# Patient Record
Sex: Male | Born: 1960 | Race: Black or African American | Hispanic: No | Marital: Single | Smoking: Former smoker
Health system: Southern US, Community
[De-identification: ages and names within clinical notes are randomized; demographics above are authoritative.]

## PROBLEM LIST (undated history)

## (undated) DIAGNOSIS — I10 Essential (primary) hypertension: Secondary | ICD-10-CM

## (undated) DIAGNOSIS — M1990 Unspecified osteoarthritis, unspecified site: Secondary | ICD-10-CM

## (undated) DIAGNOSIS — J449 Chronic obstructive pulmonary disease, unspecified: Secondary | ICD-10-CM

## (undated) HISTORY — PX: REPLACEMENT TOTAL KNEE: SUR1224

---

## 1998-05-14 ENCOUNTER — Encounter: Payer: Self-pay | Admitting: Family Medicine

## 1998-05-14 ENCOUNTER — Inpatient Hospital Stay (HOSPITAL_COMMUNITY): Admission: EM | Admit: 1998-05-14 | Discharge: 1998-05-15 | Payer: Self-pay | Admitting: Emergency Medicine

## 1998-05-24 ENCOUNTER — Encounter: Admission: RE | Admit: 1998-05-24 | Discharge: 1998-05-24 | Payer: Self-pay | Admitting: Family Medicine

## 1998-11-22 ENCOUNTER — Emergency Department (HOSPITAL_COMMUNITY): Admission: EM | Admit: 1998-11-22 | Discharge: 1998-11-22 | Payer: Self-pay | Admitting: Emergency Medicine

## 1999-01-19 ENCOUNTER — Ambulatory Visit: Admission: RE | Admit: 1999-01-19 | Discharge: 1999-01-19 | Payer: Self-pay

## 1999-06-02 ENCOUNTER — Emergency Department (HOSPITAL_COMMUNITY): Admission: EM | Admit: 1999-06-02 | Discharge: 1999-06-02 | Payer: Self-pay | Admitting: Emergency Medicine

## 1999-06-17 ENCOUNTER — Emergency Department (HOSPITAL_COMMUNITY): Admission: EM | Admit: 1999-06-17 | Discharge: 1999-06-17 | Payer: Self-pay | Admitting: *Deleted

## 1999-12-01 ENCOUNTER — Encounter: Payer: Self-pay | Admitting: Emergency Medicine

## 1999-12-01 ENCOUNTER — Emergency Department (HOSPITAL_COMMUNITY): Admission: EM | Admit: 1999-12-01 | Discharge: 1999-12-01 | Payer: Self-pay | Admitting: Emergency Medicine

## 2000-06-06 ENCOUNTER — Emergency Department (HOSPITAL_COMMUNITY): Admission: EM | Admit: 2000-06-06 | Discharge: 2000-06-06 | Payer: Self-pay

## 2000-06-06 ENCOUNTER — Encounter: Payer: Self-pay | Admitting: Emergency Medicine

## 2001-03-24 ENCOUNTER — Inpatient Hospital Stay (HOSPITAL_COMMUNITY): Admission: EM | Admit: 2001-03-24 | Discharge: 2001-03-28 | Payer: Self-pay | Admitting: Infectious Diseases

## 2001-03-25 ENCOUNTER — Encounter: Payer: Self-pay | Admitting: Infectious Diseases

## 2001-06-07 ENCOUNTER — Encounter: Payer: Self-pay | Admitting: Emergency Medicine

## 2001-06-07 ENCOUNTER — Inpatient Hospital Stay (HOSPITAL_COMMUNITY): Admission: EM | Admit: 2001-06-07 | Discharge: 2001-06-11 | Payer: Self-pay | Admitting: Emergency Medicine

## 2001-06-08 ENCOUNTER — Encounter: Payer: Self-pay | Admitting: Internal Medicine

## 2001-07-19 ENCOUNTER — Inpatient Hospital Stay (HOSPITAL_COMMUNITY): Admission: EM | Admit: 2001-07-19 | Discharge: 2001-07-20 | Payer: Self-pay | Admitting: Emergency Medicine

## 2001-07-19 ENCOUNTER — Encounter: Payer: Self-pay | Admitting: Emergency Medicine

## 2001-09-14 ENCOUNTER — Emergency Department (HOSPITAL_COMMUNITY): Admission: EM | Admit: 2001-09-14 | Discharge: 2001-09-14 | Payer: Self-pay | Admitting: Emergency Medicine

## 2001-09-14 ENCOUNTER — Encounter: Payer: Self-pay | Admitting: Emergency Medicine

## 2001-10-12 ENCOUNTER — Inpatient Hospital Stay (HOSPITAL_COMMUNITY): Admission: EM | Admit: 2001-10-12 | Discharge: 2001-10-14 | Payer: Self-pay

## 2001-10-12 ENCOUNTER — Encounter: Payer: Self-pay | Admitting: Infectious Diseases

## 2001-11-10 ENCOUNTER — Encounter: Payer: Self-pay | Admitting: Emergency Medicine

## 2001-11-10 ENCOUNTER — Emergency Department (HOSPITAL_COMMUNITY): Admission: EM | Admit: 2001-11-10 | Discharge: 2001-11-11 | Payer: Self-pay | Admitting: Emergency Medicine

## 2001-12-10 ENCOUNTER — Emergency Department (HOSPITAL_COMMUNITY): Admission: EM | Admit: 2001-12-10 | Discharge: 2001-12-10 | Payer: Self-pay | Admitting: Emergency Medicine

## 2002-01-13 ENCOUNTER — Emergency Department (HOSPITAL_COMMUNITY): Admission: EM | Admit: 2002-01-13 | Discharge: 2002-01-13 | Payer: Self-pay | Admitting: Emergency Medicine

## 2002-02-03 ENCOUNTER — Emergency Department (HOSPITAL_COMMUNITY): Admission: EM | Admit: 2002-02-03 | Discharge: 2002-02-03 | Payer: Self-pay | Admitting: Emergency Medicine

## 2002-02-05 ENCOUNTER — Inpatient Hospital Stay (HOSPITAL_COMMUNITY): Admission: EM | Admit: 2002-02-05 | Discharge: 2002-02-07 | Payer: Self-pay | Admitting: *Deleted

## 2002-02-06 ENCOUNTER — Encounter: Payer: Self-pay | Admitting: Internal Medicine

## 2002-03-09 ENCOUNTER — Inpatient Hospital Stay (HOSPITAL_COMMUNITY): Admission: EM | Admit: 2002-03-09 | Discharge: 2002-03-11 | Payer: Self-pay | Admitting: Emergency Medicine

## 2002-03-09 ENCOUNTER — Encounter: Payer: Self-pay | Admitting: Emergency Medicine

## 2002-03-10 ENCOUNTER — Encounter (INDEPENDENT_AMBULATORY_CARE_PROVIDER_SITE_OTHER): Payer: Self-pay | Admitting: Cardiology

## 2002-03-10 ENCOUNTER — Encounter: Payer: Self-pay | Admitting: Internal Medicine

## 2002-05-12 ENCOUNTER — Inpatient Hospital Stay (HOSPITAL_COMMUNITY): Admission: EM | Admit: 2002-05-12 | Discharge: 2002-05-12 | Payer: Self-pay | Admitting: Emergency Medicine

## 2002-05-12 ENCOUNTER — Encounter: Payer: Self-pay | Admitting: Internal Medicine

## 2002-06-09 ENCOUNTER — Emergency Department (HOSPITAL_COMMUNITY): Admission: EM | Admit: 2002-06-09 | Discharge: 2002-06-10 | Payer: Self-pay | Admitting: Emergency Medicine

## 2002-06-10 ENCOUNTER — Encounter: Payer: Self-pay | Admitting: Emergency Medicine

## 2002-07-03 ENCOUNTER — Encounter: Payer: Self-pay | Admitting: Emergency Medicine

## 2002-07-03 ENCOUNTER — Emergency Department (HOSPITAL_COMMUNITY): Admission: EM | Admit: 2002-07-03 | Discharge: 2002-07-04 | Payer: Self-pay | Admitting: Emergency Medicine

## 2002-07-11 ENCOUNTER — Emergency Department (HOSPITAL_COMMUNITY): Admission: EM | Admit: 2002-07-11 | Discharge: 2002-07-11 | Payer: Self-pay | Admitting: Emergency Medicine

## 2002-08-05 ENCOUNTER — Emergency Department (HOSPITAL_COMMUNITY): Admission: EM | Admit: 2002-08-05 | Discharge: 2002-08-05 | Payer: Self-pay | Admitting: Emergency Medicine

## 2002-09-24 ENCOUNTER — Emergency Department (HOSPITAL_COMMUNITY): Admission: EM | Admit: 2002-09-24 | Discharge: 2002-09-24 | Payer: Self-pay | Admitting: Emergency Medicine

## 2002-11-03 ENCOUNTER — Emergency Department (HOSPITAL_COMMUNITY): Admission: EM | Admit: 2002-11-03 | Discharge: 2002-11-03 | Payer: Self-pay | Admitting: Emergency Medicine

## 2002-11-03 ENCOUNTER — Encounter: Payer: Self-pay | Admitting: Emergency Medicine

## 2002-12-15 ENCOUNTER — Emergency Department (HOSPITAL_COMMUNITY): Admission: EM | Admit: 2002-12-15 | Discharge: 2002-12-15 | Payer: Self-pay | Admitting: Emergency Medicine

## 2003-02-09 ENCOUNTER — Emergency Department (HOSPITAL_COMMUNITY): Admission: EM | Admit: 2003-02-09 | Discharge: 2003-02-09 | Payer: Self-pay | Admitting: Emergency Medicine

## 2003-04-24 ENCOUNTER — Emergency Department (HOSPITAL_COMMUNITY): Admission: EM | Admit: 2003-04-24 | Discharge: 2003-04-24 | Payer: Self-pay | Admitting: Emergency Medicine

## 2003-09-08 ENCOUNTER — Emergency Department (HOSPITAL_COMMUNITY): Admission: EM | Admit: 2003-09-08 | Discharge: 2003-09-08 | Payer: Self-pay | Admitting: *Deleted

## 2003-09-14 ENCOUNTER — Inpatient Hospital Stay (HOSPITAL_COMMUNITY): Admission: EM | Admit: 2003-09-14 | Discharge: 2003-09-17 | Payer: Self-pay | Admitting: Emergency Medicine

## 2004-02-02 ENCOUNTER — Emergency Department (HOSPITAL_COMMUNITY): Admission: EM | Admit: 2004-02-02 | Discharge: 2004-02-02 | Payer: Self-pay | Admitting: Emergency Medicine

## 2004-02-10 ENCOUNTER — Emergency Department (HOSPITAL_COMMUNITY): Admission: EM | Admit: 2004-02-10 | Discharge: 2004-02-10 | Payer: Self-pay | Admitting: Emergency Medicine

## 2004-02-17 ENCOUNTER — Emergency Department (HOSPITAL_COMMUNITY): Admission: EM | Admit: 2004-02-17 | Discharge: 2004-02-17 | Payer: Self-pay | Admitting: Emergency Medicine

## 2004-02-23 ENCOUNTER — Inpatient Hospital Stay (HOSPITAL_COMMUNITY): Admission: EM | Admit: 2004-02-23 | Discharge: 2004-02-25 | Payer: Self-pay | Admitting: *Deleted

## 2004-04-14 ENCOUNTER — Emergency Department (HOSPITAL_COMMUNITY): Admission: EM | Admit: 2004-04-14 | Discharge: 2004-04-14 | Payer: Self-pay

## 2004-05-01 ENCOUNTER — Inpatient Hospital Stay (HOSPITAL_COMMUNITY): Admission: EM | Admit: 2004-05-01 | Discharge: 2004-05-03 | Payer: Self-pay | Admitting: Emergency Medicine

## 2004-05-01 ENCOUNTER — Ambulatory Visit: Payer: Self-pay | Admitting: Internal Medicine

## 2004-06-13 ENCOUNTER — Emergency Department (HOSPITAL_COMMUNITY): Admission: EM | Admit: 2004-06-13 | Discharge: 2004-06-13 | Payer: Self-pay | Admitting: Emergency Medicine

## 2004-09-06 ENCOUNTER — Emergency Department (HOSPITAL_COMMUNITY): Admission: EM | Admit: 2004-09-06 | Discharge: 2004-09-06 | Payer: Self-pay | Admitting: Emergency Medicine

## 2004-11-01 ENCOUNTER — Emergency Department (HOSPITAL_COMMUNITY): Admission: EM | Admit: 2004-11-01 | Discharge: 2004-11-01 | Payer: Self-pay | Admitting: Emergency Medicine

## 2004-11-09 ENCOUNTER — Inpatient Hospital Stay (HOSPITAL_COMMUNITY): Admission: EM | Admit: 2004-11-09 | Discharge: 2004-11-11 | Payer: Self-pay | Admitting: Emergency Medicine

## 2004-12-26 ENCOUNTER — Emergency Department (HOSPITAL_COMMUNITY): Admission: EM | Admit: 2004-12-26 | Discharge: 2004-12-26 | Payer: Self-pay | Admitting: Emergency Medicine

## 2005-01-03 ENCOUNTER — Emergency Department (HOSPITAL_COMMUNITY): Admission: EM | Admit: 2005-01-03 | Discharge: 2005-01-04 | Payer: Self-pay | Admitting: Emergency Medicine

## 2005-01-19 ENCOUNTER — Emergency Department (HOSPITAL_COMMUNITY): Admission: EM | Admit: 2005-01-19 | Discharge: 2005-01-19 | Payer: Self-pay | Admitting: Emergency Medicine

## 2005-02-07 ENCOUNTER — Ambulatory Visit: Payer: Self-pay | Admitting: Family Medicine

## 2005-02-07 ENCOUNTER — Inpatient Hospital Stay (HOSPITAL_COMMUNITY): Admission: EM | Admit: 2005-02-07 | Discharge: 2005-02-09 | Payer: Self-pay | Admitting: Emergency Medicine

## 2005-03-21 ENCOUNTER — Observation Stay (HOSPITAL_COMMUNITY): Admission: EM | Admit: 2005-03-21 | Discharge: 2005-03-22 | Payer: Self-pay | Admitting: Emergency Medicine

## 2005-04-06 ENCOUNTER — Inpatient Hospital Stay (HOSPITAL_COMMUNITY): Admission: EM | Admit: 2005-04-06 | Discharge: 2005-04-08 | Payer: Self-pay | Admitting: Emergency Medicine

## 2005-07-10 ENCOUNTER — Emergency Department (HOSPITAL_COMMUNITY): Admission: EM | Admit: 2005-07-10 | Discharge: 2005-07-10 | Payer: Self-pay | Admitting: *Deleted

## 2005-08-03 ENCOUNTER — Inpatient Hospital Stay (HOSPITAL_COMMUNITY): Admission: EM | Admit: 2005-08-03 | Discharge: 2005-08-04 | Payer: Self-pay | Admitting: Emergency Medicine

## 2005-08-03 ENCOUNTER — Ambulatory Visit: Payer: Self-pay | Admitting: Sports Medicine

## 2005-08-29 ENCOUNTER — Ambulatory Visit: Payer: Self-pay | Admitting: Family Medicine

## 2005-09-27 IMAGING — CR DG CHEST 1V PORT
1 series · 1 of 1 positions shown · non-contrast
Comparison: none

CLINICAL DATA: Short of breath, chest pain, hypertension, nonsmoker.  Respiratory distress.
 PORTABLE ONE VIEW CEHST, 02/23/04, 3743 HOURS:
 An AP semi-erect portable film of the chest dated 02/23/04 at 3743 hours is made and compared to previous study of 09/14/03 at 5235 hours and shows again severe chronic obstructive lung changes with marked scarring.  Bleb and bullae formation in region of the apices and left lung base.  There is also some generalized peribronchial thickening of the hilar and right lower lobe areas.  No consolidation is seen.  There is blunting of both costophrenic angles.  The heart and mediastinum are normal.
 IMPRESSION
 Severe chronic obstructive pulmonary disease with no definite acute infiltrate or consolidation.  Heart is normal.

[view not recorded]
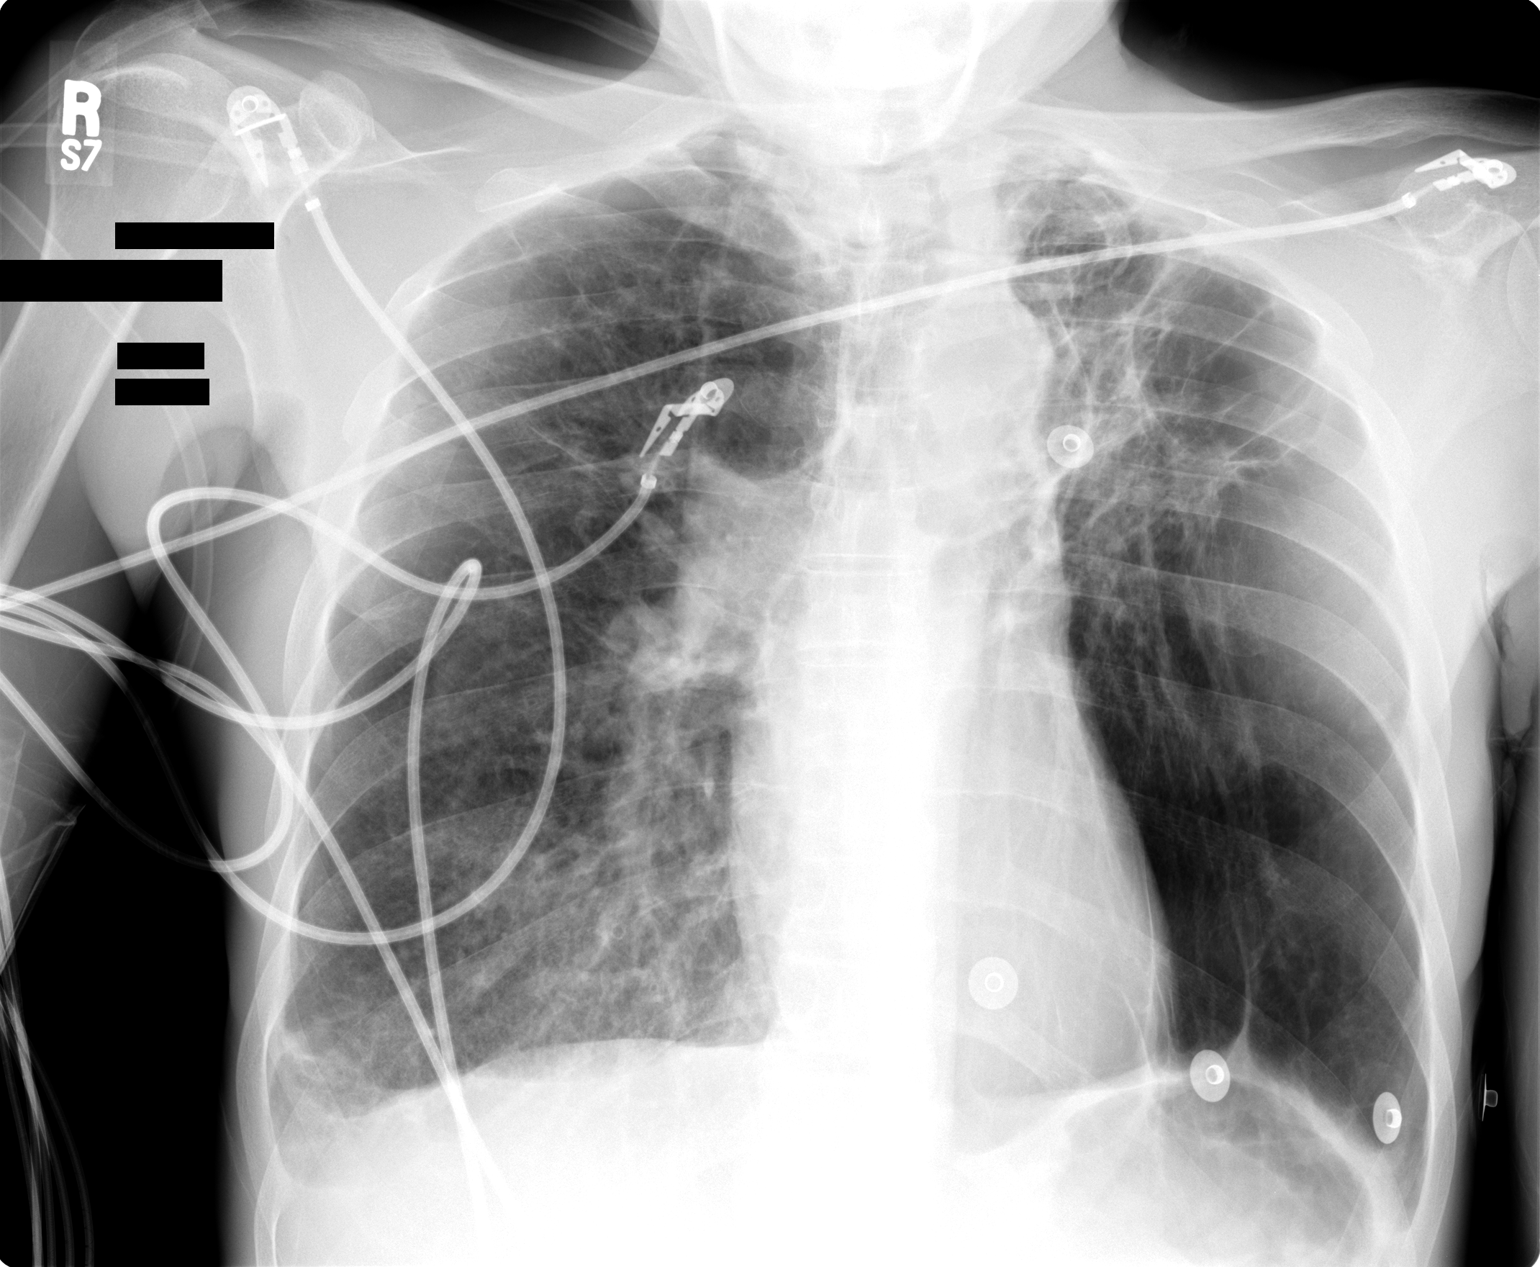

[1 of 1 positions shown; findings below may reference images not displayed]

## 2005-10-17 ENCOUNTER — Ambulatory Visit: Payer: Self-pay | Admitting: Sports Medicine

## 2005-10-17 ENCOUNTER — Inpatient Hospital Stay (HOSPITAL_COMMUNITY): Admission: EM | Admit: 2005-10-17 | Discharge: 2005-10-19 | Payer: Self-pay | Admitting: Emergency Medicine

## 2005-10-18 ENCOUNTER — Encounter: Payer: Self-pay | Admitting: Vascular Surgery

## 2006-02-20 ENCOUNTER — Emergency Department (HOSPITAL_COMMUNITY): Admission: EM | Admit: 2006-02-20 | Discharge: 2006-02-21 | Payer: Self-pay | Admitting: Emergency Medicine

## 2006-06-11 ENCOUNTER — Inpatient Hospital Stay (HOSPITAL_COMMUNITY): Admission: EM | Admit: 2006-06-11 | Discharge: 2006-06-16 | Payer: Self-pay | Admitting: Emergency Medicine

## 2006-08-11 ENCOUNTER — Emergency Department (HOSPITAL_COMMUNITY): Admission: EM | Admit: 2006-08-11 | Discharge: 2006-08-11 | Payer: Self-pay | Admitting: Emergency Medicine

## 2006-08-13 ENCOUNTER — Ambulatory Visit: Payer: Self-pay | Admitting: Family Medicine

## 2006-08-29 ENCOUNTER — Ambulatory Visit (HOSPITAL_COMMUNITY): Admission: RE | Admit: 2006-08-29 | Discharge: 2006-08-29 | Payer: Self-pay | Admitting: Family Medicine

## 2006-10-08 ENCOUNTER — Ambulatory Visit: Payer: Self-pay | Admitting: Family Medicine

## 2006-12-04 ENCOUNTER — Ambulatory Visit: Payer: Self-pay | Admitting: Family Medicine

## 2007-05-08 ENCOUNTER — Ambulatory Visit: Payer: Self-pay | Admitting: Family Medicine

## 2007-07-30 ENCOUNTER — Ambulatory Visit: Payer: Self-pay | Admitting: Family Medicine

## 2007-07-30 LAB — CONVERTED CEMR LAB
AST: 14 units/L (ref 0–37)
Alkaline Phosphatase: 84 units/L (ref 39–117)
BUN: 14 mg/dL (ref 6–23)
Basophils Absolute: 0 10*3/uL (ref 0.0–0.1)
Basophils Relative: 1 % (ref 0–1)
Calcium: 9.5 mg/dL (ref 8.4–10.5)
Creatinine, Ser: 1.55 mg/dL — ABNORMAL HIGH (ref 0.40–1.50)
Eosinophils Relative: 13 % — ABNORMAL HIGH (ref 0–5)
HCT: 40.3 % (ref 39.0–52.0)
Hemoglobin: 13.6 g/dL (ref 13.0–17.0)
MCHC: 33.7 g/dL (ref 30.0–36.0)
Monocytes Absolute: 0.7 10*3/uL (ref 0.1–1.0)
RDW: 12.9 % (ref 11.5–15.5)

## 2007-09-17 ENCOUNTER — Emergency Department (HOSPITAL_COMMUNITY): Admission: EM | Admit: 2007-09-17 | Discharge: 2007-09-18 | Payer: Self-pay | Admitting: Emergency Medicine

## 2007-10-10 ENCOUNTER — Emergency Department (HOSPITAL_COMMUNITY): Admission: EM | Admit: 2007-10-10 | Discharge: 2007-10-10 | Payer: Self-pay | Admitting: Emergency Medicine

## 2007-11-11 ENCOUNTER — Ambulatory Visit: Payer: Self-pay | Admitting: Internal Medicine

## 2008-03-19 ENCOUNTER — Emergency Department (HOSPITAL_COMMUNITY): Admission: EM | Admit: 2008-03-19 | Discharge: 2008-03-19 | Payer: Self-pay | Admitting: Emergency Medicine

## 2008-05-26 ENCOUNTER — Ambulatory Visit: Payer: Self-pay | Admitting: Family Medicine

## 2008-07-15 ENCOUNTER — Inpatient Hospital Stay (HOSPITAL_COMMUNITY): Admission: EM | Admit: 2008-07-15 | Discharge: 2008-07-18 | Payer: Self-pay | Admitting: Emergency Medicine

## 2008-12-24 ENCOUNTER — Ambulatory Visit: Payer: Self-pay | Admitting: Internal Medicine

## 2008-12-24 ENCOUNTER — Encounter (INDEPENDENT_AMBULATORY_CARE_PROVIDER_SITE_OTHER): Payer: Self-pay | Admitting: Family Medicine

## 2008-12-24 LAB — CONVERTED CEMR LAB
ALT: 10 units/L (ref 0–53)
AST: 14 units/L (ref 0–37)
Albumin: 4.3 g/dL (ref 3.5–5.2)
BUN: 12 mg/dL (ref 6–23)
Calcium: 9.6 mg/dL (ref 8.4–10.5)
Chloride: 99 meq/L (ref 96–112)
Eosinophils Absolute: 0.6 10*3/uL (ref 0.0–0.7)
HCT: 41.3 % (ref 39.0–52.0)
Lymphocytes Relative: 25 % (ref 12–46)
Lymphs Abs: 1.7 10*3/uL (ref 0.7–4.0)
MCV: 94.7 fL (ref 78.0–100.0)
Monocytes Relative: 13 % — ABNORMAL HIGH (ref 3–12)
Neutrophils Relative %: 53 % (ref 43–77)
Potassium: 4.2 meq/L (ref 3.5–5.3)
RBC: 4.36 M/uL (ref 4.22–5.81)
Sodium: 136 meq/L (ref 135–145)
Total Protein: 7.8 g/dL (ref 6.0–8.3)
WBC: 6.9 10*3/uL (ref 4.0–10.5)

## 2009-02-18 ENCOUNTER — Ambulatory Visit: Payer: Self-pay | Admitting: Family Medicine

## 2009-04-01 ENCOUNTER — Ambulatory Visit: Payer: Self-pay | Admitting: Family Medicine

## 2009-04-01 LAB — CONVERTED CEMR LAB
AST: 12 units/L (ref 0–37)
Alkaline Phosphatase: 57 units/L (ref 39–117)
BUN: 19 mg/dL (ref 6–23)
Basophils Absolute: 0.1 10*3/uL (ref 0.0–0.1)
Calcium: 9.2 mg/dL (ref 8.4–10.5)
Creatinine, Ser: 1.41 mg/dL (ref 0.40–1.50)
Eosinophils Absolute: 0.6 10*3/uL (ref 0.0–0.7)
Eosinophils Relative: 15 % — ABNORMAL HIGH (ref 0–5)
Glucose, Bld: 91 mg/dL (ref 70–99)
HCT: 39.2 % (ref 39.0–52.0)
Hemoglobin: 13.6 g/dL (ref 13.0–17.0)
MCHC: 34.7 g/dL (ref 30.0–36.0)
MCV: 89.9 fL (ref 78.0–100.0)
Monocytes Absolute: 0.6 10*3/uL (ref 0.1–1.0)
Platelets: 337 10*3/uL (ref 150–400)
RDW: 13.8 % (ref 11.5–15.5)

## 2009-04-26 ENCOUNTER — Ambulatory Visit: Payer: Self-pay | Admitting: Internal Medicine

## 2009-08-12 ENCOUNTER — Ambulatory Visit: Payer: Self-pay | Admitting: Family Medicine

## 2009-08-26 ENCOUNTER — Ambulatory Visit: Payer: Self-pay | Admitting: Internal Medicine

## 2009-08-26 ENCOUNTER — Encounter (INDEPENDENT_AMBULATORY_CARE_PROVIDER_SITE_OTHER): Payer: Self-pay | Admitting: Family Medicine

## 2009-08-26 LAB — CONVERTED CEMR LAB
Amphetamine Screen, Ur: NEGATIVE
Barbiturate Quant, Ur: NEGATIVE
Cocaine Metabolites: POSITIVE — AB
Creatinine,U: 129.1 mg/dL
Opiate Screen, Urine: NEGATIVE
Phencyclidine (PCP): NEGATIVE

## 2009-09-10 ENCOUNTER — Ambulatory Visit: Payer: Self-pay | Admitting: Internal Medicine

## 2009-10-14 ENCOUNTER — Ambulatory Visit: Payer: Self-pay | Admitting: Family Medicine

## 2009-10-14 LAB — CONVERTED CEMR LAB
BUN: 7 mg/dL (ref 6–23)
Barbiturate Quant, Ur: NEGATIVE
CO2: 23 meq/L (ref 19–32)
Chloride: 102 meq/L (ref 96–112)
Creatinine, Ser: 0.8 mg/dL (ref 0.40–1.50)
Creatinine,U: 104.8 mg/dL
Eosinophils Absolute: 0.9 10*3/uL — ABNORMAL HIGH (ref 0.0–0.7)
Eosinophils Relative: 14 % — ABNORMAL HIGH (ref 0–5)
Glucose, Bld: 86 mg/dL (ref 70–99)
HCT: 38.5 % — ABNORMAL LOW (ref 39.0–52.0)
Hemoglobin: 13.1 g/dL (ref 13.0–17.0)
Lymphocytes Relative: 22 % (ref 12–46)
Lymphs Abs: 1.3 10*3/uL (ref 0.7–4.0)
MCV: 89.5 fL (ref 78.0–100.0)
Marijuana Metabolite: NEGATIVE
Methadone: NEGATIVE
Monocytes Absolute: 0.7 10*3/uL (ref 0.1–1.0)
Opiate Screen, Urine: NEGATIVE
Phencyclidine (PCP): NEGATIVE
Platelets: 376 10*3/uL (ref 150–400)
Potassium: 4.6 meq/L (ref 3.5–5.3)
Propoxyphene: NEGATIVE
WBC: 6.2 10*3/uL (ref 4.0–10.5)

## 2009-11-25 ENCOUNTER — Ambulatory Visit: Payer: Self-pay | Admitting: Family Medicine

## 2010-01-04 ENCOUNTER — Ambulatory Visit: Payer: Self-pay | Admitting: Family Medicine

## 2010-01-21 ENCOUNTER — Ambulatory Visit: Payer: Self-pay | Admitting: Family Medicine

## 2010-02-22 ENCOUNTER — Ambulatory Visit: Payer: Self-pay | Admitting: Internal Medicine

## 2010-04-26 ENCOUNTER — Ambulatory Visit: Payer: Self-pay | Admitting: Family Medicine

## 2010-05-10 ENCOUNTER — Emergency Department (HOSPITAL_COMMUNITY)
Admission: EM | Admit: 2010-05-10 | Discharge: 2010-05-10 | Payer: Self-pay | Source: Home / Self Care | Admitting: Emergency Medicine

## 2010-07-27 ENCOUNTER — Emergency Department (HOSPITAL_COMMUNITY)
Admission: EM | Admit: 2010-07-27 | Discharge: 2010-07-27 | Payer: Self-pay | Source: Home / Self Care | Admitting: Emergency Medicine

## 2010-08-01 LAB — PROTIME-INR
INR: 0.98 (ref 0.00–1.49)
Prothrombin Time: 13.2 seconds (ref 11.6–15.2)

## 2010-08-01 LAB — DIFFERENTIAL
Basophils Absolute: 0.1 10*3/uL (ref 0.0–0.1)
Basophils Relative: 1 % (ref 0–1)
Eosinophils Absolute: 0.7 10*3/uL (ref 0.0–0.7)
Eosinophils Relative: 9 % — ABNORMAL HIGH (ref 0–5)
Lymphocytes Relative: 21 % (ref 12–46)
Lymphs Abs: 1.6 10*3/uL (ref 0.7–4.0)
Monocytes Absolute: 1.2 10*3/uL — ABNORMAL HIGH (ref 0.1–1.0)
Monocytes Relative: 16 % — ABNORMAL HIGH (ref 3–12)
Neutro Abs: 3.8 10*3/uL (ref 1.7–7.7)
Neutrophils Relative %: 52 % (ref 43–77)

## 2010-08-01 LAB — CBC
HCT: 39.4 % (ref 39.0–52.0)
Hemoglobin: 13.5 g/dL (ref 13.0–17.0)
MCH: 31.3 pg (ref 26.0–34.0)
MCHC: 34.3 g/dL (ref 30.0–36.0)
MCV: 91.2 fL (ref 78.0–100.0)
Platelets: 303 10*3/uL (ref 150–400)
RBC: 4.32 MIL/uL (ref 4.22–5.81)
RDW: 15.4 % (ref 11.5–15.5)
WBC: 7.3 10*3/uL (ref 4.0–10.5)

## 2010-08-01 LAB — BASIC METABOLIC PANEL
BUN: 9 mg/dL (ref 6–23)
CO2: 27 mEq/L (ref 19–32)
Calcium: 9.9 mg/dL (ref 8.4–10.5)
Chloride: 106 mEq/L (ref 96–112)
Creatinine, Ser: 0.98 mg/dL (ref 0.4–1.5)
GFR calc Af Amer: >60 mL/min (ref >60)
GFR calc non Af Amer: >60 mL/min (ref >60)
Glucose, Bld: 79 mg/dL (ref 70–99)
Potassium: 4.5 mEq/L (ref 3.5–5.1)
Sodium: 140 mEq/L (ref 135–145)

## 2010-08-01 LAB — URINALYSIS, ROUTINE W REFLEX MICROSCOPIC
Bilirubin Urine: NEGATIVE
Hgb urine dipstick: NEGATIVE
Ketones, ur: NEGATIVE mg/dL
Nitrite: NEGATIVE
Protein, ur: NEGATIVE mg/dL
Specific Gravity, Urine: 1.018 (ref 1.005–1.030)
Urine Glucose, Fasting: NEGATIVE mg/dL
Urobilinogen, UA: 0.2 mg/dL (ref 0.0–1.0)
pH: 6.5 (ref 5.0–8.0)

## 2010-08-01 LAB — SURGICAL PCR SCREEN
MRSA, PCR: NEGATIVE
Staphylococcus aureus: NEGATIVE

## 2010-08-01 LAB — APTT: aPTT: 35 seconds (ref 24–37)

## 2010-08-02 ENCOUNTER — Inpatient Hospital Stay (HOSPITAL_COMMUNITY)
Admission: RE | Admit: 2010-08-02 | Discharge: 2010-08-04 | Payer: Self-pay | Source: Home / Self Care | Attending: Orthopedic Surgery | Admitting: Orthopedic Surgery

## 2010-08-03 LAB — TYPE AND SCREEN
ABO/RH(D): B POS
Antibody Screen: NEGATIVE

## 2010-08-03 LAB — ABO/RH: ABO/RH(D): B POS

## 2010-08-07 ENCOUNTER — Encounter: Payer: Self-pay | Admitting: Family Medicine

## 2010-08-08 LAB — BASIC METABOLIC PANEL
BUN: 8 mg/dL (ref 6–23)
Chloride: 105 mEq/L (ref 96–112)
Chloride: 108 mEq/L (ref 96–112)
GFR calc Af Amer: >60 mL/min (ref >60)
GFR calc non Af Amer: >60 mL/min (ref >60)
Glucose, Bld: 126 mg/dL — ABNORMAL HIGH (ref 70–99)
Potassium: 3.5 mEq/L (ref 3.5–5.1)
Potassium: 3.6 mEq/L (ref 3.5–5.1)
Sodium: 134 mEq/L — ABNORMAL LOW (ref 135–145)

## 2010-08-08 LAB — CBC
HCT: 26.5 % — ABNORMAL LOW (ref 39.0–52.0)
MCH: 30.9 pg (ref 26.0–34.0)
MCV: 93 fL (ref 78.0–100.0)
Platelets: 214 10*3/uL (ref 150–400)
RBC: 3.04 MIL/uL — ABNORMAL LOW (ref 4.22–5.81)
RBC: 2.85 MIL/uL — ABNORMAL LOW (ref 4.22–5.81)
RDW: 15.3 % (ref 11.5–15.5)
WBC: 12.2 10*3/uL — ABNORMAL HIGH (ref 4.0–10.5)
WBC: 14.2 10*3/uL — ABNORMAL HIGH (ref 4.0–10.5)

## 2010-08-08 NOTE — H&P (Signed)
NAME:  [name], [name]                 ACCOUNT NO.:  [id number]  MEDICAL RECORD NO.:  [id number]          PATIENT TYPE:  INP  LOCATION:  NA                           FACILITY:  [facility]  PHYSICIAN:  Madlyn Frankel. Charlann Boxer, M.D.  DATE OF BIRTH:  1961/03/09  DATE OF ADMISSION: DATE OF DISCHARGE:                             HISTORY & PHYSICAL   ADMITTING DIAGNOSIS:  Right knee osteoarthritis.  BRIEF HISTORY:  This is a patient who was seen last in late August by Dr. Charlann Boxer at the request of Dr. Jillyn Hidden.  The patient has had a history with narcotic use for pain issues given to him through [facility].  He has also got various flexion contractures with his knees.  Dr. Charlann Boxer has discussed with the patient and given him the facts that he may have a poor outcome with surgery, but the patient has decided to proceed with arthroplasty due to this pain.  PAST MEDICAL HISTORY:  Significant for asthma and COPD.  He has got a history of hypertension.  He also has a history of transient fevers and hives.  He has muscular pain and joint pain.  PAST SURGICAL HISTORY:  The patient does not list any surgeries on his preoperative sheet.  CURRENT MEDICATIONS:  Advair twice daily, Spiriva daily, Singulair once a day felt needed daily and prednisone daily.  ALLERGIES:  He does not list any medicine allergies.  SOCIAL HISTORY:  He is single.  He does not list an occupation.  He has a past history of tobacco.  He has a current history of alcohol use and no history of street drug use.  He has 3 children.  FAMILY HISTORY:  His parents are both deceased.  He does not describe siblings.  REVIEW OF SYSTEMS:  Notable for those difficulties described in history of present illness and past medical history.  His review of systems sheet is otherwise unremarkable.  PHYSICAL EXAMINATION:  VITAL SIGNS:  The patient is 5 feet 7 inches, 143 pounds, blood pressure is 130/80, his respirations are 20 and his pulse is 80. GENERAL:   General health is fair. HEENT:  Shows poor dentition. NECK AND CHEST:  Unremarkable.  He has diminished bilaterally.  He has a history of COPD, possible emphysema.  He has a history of asthma. HEART:  Has S1, S2.  There are no murmurs, rubs or gallops heard.  He does have a history of hypertension. ABDOMEN:  Soft and nondistended. GI AND GU:  Otherwise unremarkable. EXTREMITIES:  Shows widespread osteoarthritis and joint pain. DERMATOLOGICAL:  He is intact. NEUROLOGICAL:  He is intact.  His labs, EKG and chest x-ray were completed yesterday through [facility].  IMPRESSION:  Right knee osteoarthritis.  PLAN:  He will undergo a right knee total arthroplasty with Dr. Charlann Boxer on January 17.  If he has any difficulty between now and then, he will give [place] a call.  His discharge medications were given to him including Robaxin, MiraLax, Colace and iron.  His DVT prevention and pain medicine will be given to him in the hospital.     Perlie Gold L. Webb Silversmith, RN  ______________________________ Madlyn Frankel Charlann Boxer, M.D. RLW/MEDQ  D:  07/28/2010  T:  07/28/2010  Job:  [id number]  Electronically Signed by Lauree Chandler NP-C on 08/05/2010 09:47:30 AM Electronically Signed by Durene Romans M.D. on 08/08/2010 09:34:35 AM

## 2010-11-29 NOTE — Discharge Summary (Signed)
NAMESKYELAR, SWIGART NO.:  [id number]   MEDICAL RECORD NO.:  [id number]          PATIENT TYPE:  INP   LOCATION:  3001                         FACILITY:  [facility]   PHYSICIAN:  Altha Harm, MDDATE OF BIRTH:  05/23/1961   DATE OF ADMISSION:  07/15/2008  DATE OF DISCHARGE:  07/18/2008                               DISCHARGE SUMMARY   DISCHARGE DISPOSITION:  Home.   FINAL DISCHARGE DIAGNOSES:  1. Acute exacerbation of emphysema and chronic obstructive pulmonary      disease.  2. Acute tracheal bronchitis.  3. Hypertension.  4. History of bronchial asthma.  5. History of rheumatoid arthritis.  6. Previous history of tobacco use disorder.   DISCHARGE MEDICATIONS:  1. Advair Discus 500/50 1 puff p.o. b.i.d.  2. Albuterol MDI 2 puffs inhaled q.2h. p.r.n.  3. Diovan 160 mg p.o. daily.  4. Singulair 10 mg p.o. daily.  5. Spiriva HandiHaler 18 mcg p.o. daily.  6. Sulfasalazine 500 mg p.o. b.i.d.  7. Folic acid 1 mg p.o. daily.  8. Avelox 400 mg p.o. daily x3.  9. Prednisone taper over 10 days, from 50 down to 10.   CONSULTANTS:  None.   PROCEDURE:  None.   DIAGNOSTIC STUDIES:  Chest x-ray, 2 view, done on admission, which  showed severe sensory lobar and paraseptal emphysema with bilateral  pleural parenchymal scarring and architectural distortion.  No acute  findings.   PERTINENT LABS:  The patient had TSH, found to be low at 0.274.   ALLERGIES:  CODEINE.   PRIMARY CARE PHYSICIAN:  Dr. Audria Nine of [facility].   CHIEF COMPLAINT:  Shortness of breath.   HISTORY OF PRESENT ILLNESS:  Please refer to the H and P dictated by Dr.  Toniann Fail for details of the HPI.   HOSPITAL COURSE:  The patient was admitted with acute exacerbation of  COPD/asthma.  He was started on IV Solu-Medrol, beta agonist, and a  controlled medication with Spiriva.  The patient was also assessed as  having acute tracheal bronchitis and started on Avelox.  The patient  improved with clinical course.  He was initially hypoxic and resolved  his hypoxia with treatment.  Currently, the patient does not require any  oxygen, either at rest or with ambulation.  His breathing and work of  breathing is much improved.  The patient has been transitioned over to  oral prednisone and has done well on this.  The patient is being  discharged home on medications specific for his current hospitalization,  including Avelox for 3 days and a prednisone taper over 10 days.  He is  to continue on his usual medications, as prescribed pre-hospital.   PHYSICAL RESTRICTIONS:  None.   DIETARY RESTRICTIONS:  Patient should be on a low-sodium diet.   FOLLOW UP:  Patient is to follow up with his primary care physician.  Please note that the patient had a marginally low TSH, and I would  recommend that the patient has a TSH, free T4, and T3 repeated when he  is in a convalescent state.  Altha Harm, MD  Electronically Signed     MAM/MEDQ  D:  07/18/2008  T:  07/18/2008  Job:  [id number]

## 2010-11-29 NOTE — H&P (Signed)
NAMEJABORI, HENEGAR NO.:  [id number]   MEDICAL RECORD NO.:  [id number]          PATIENT TYPE:  INP   LOCATION:  1828                         FACILITY:  [facility]   PHYSICIAN:  Eduard Clos, MDDATE OF BIRTH:  January 05, 1961   DATE OF ADMISSION:  07/15/2008  DATE OF DISCHARGE:                              HISTORY & PHYSICAL   CHIEF COMPLAINT:  Shortness of breath.   HISTORY OF PRESENT ILLNESS:  A 50 year old male with known history of  COPD, asthma, rheumatoid arthritis, hypertension, presented with  complaints of shortness of breath.  Patient has been having shortness of  breath over the last 2-3 days with productive sputum and cough.  Denies  any associated chest pain.  Does not have any fever but has some chills.  Denies any nausea, vomiting, diarrhea, loss of consciousness, or  weakness of limbs.  Patient was found to be having shortness of breath  despite giving nebulizer and was admitted for further management of his  possible COPD exacerbation.   PAST MEDICAL HISTORY:  1. COPD.  2. Asthma.  3. Hypertension.  4. Rheumatoid arthritis.   PAST SURGICAL HISTORY:  None.   MEDICATIONS PRIOR TO ADMISSION:  1. Advair Diskus 500/50 one puff b.i.d.  2. Albuterol nebulizer.  3. Diovan 160 mg p.o. daily.  4. Singulair 10 mg p.o. daily.  5. Spiriva HandiHaler.  6. Omeprazole 20 mg p.o. b.i.d.  7. Folic acid 1 mg p.o. daily.   ALLERGIES:  No known drug allergies.   SOCIAL HISTORY:  Patient quit smoking cigarettes 6 months ago.  Drinks  alcohol weekends.  Denies any drug abuse.   FAMILY HISTORY:  Nothing contributory.   REVIEW OF SYSTEMS:  As per the history of present illness, nothing else  significant.   PHYSICAL EXAMINATION:  Patient examined at bedside.  Not in acute  distress.  Appears mildly short of breath.  VITAL SIGNS:  Blood pressure is 126/92, pulse 106 per minute,  temperature 99.1, respirations 20 per minute, O2 sat 97%.  HEENT:  Anicteric.   No pallor.  CHEST:  Bilateral air entry present.  Bilateral expiratory wheeze heard.  No crepitation.  HEART:  S1 and S2 heard.  ABDOMEN:  Soft.  Nontender.  Bowel sounds heard.  CNS:  Awake, alert and oriented to time, place, and person.  Moves upper  and lower extremities 5/5.  EXTREMITIES:  Peripheral pulses felt.  No edema.   LABS:  Chest x-ray:  Severe central lobular and paraseptal emphysema  with bilateral lower parenchymal scarring .  No acute findings.   CBC:  WBC 9.5, hemoglobin 15.3, hematocrit 46.4, platelets 277,  neutrophils 71%.   ASSESSMENT:  1. Acute exacerbation of chronic obstructive pulmonary disease.  2. History of bronchial asthma.  3. Previous history of tobacco abuse.  4. Hypertension.  5. History of rheumatoid arthritis.   PLAN:  Admit patient to telemetry.  Will resume his home medications.  Place patient on antibiotics and Tamiflu.  Also place patient on IV  steroids, nebulizers.  Further recommendations as patient's condition  evolves.  Eduard Clos, MD  Electronically Signed     ANK/MEDQ  D:  07/15/2008  T:  07/15/2008  Job:  [id number]

## 2010-12-02 NOTE — Discharge Summary (Signed)
NAMECAEDEN, FOOTS NO.:  [id number]   MEDICAL RECORD NO.:  [id number]          PATIENT TYPE:  INP   LOCATION:  5501                         FACILITY:  [facility]   PHYSICIAN:  Asencion Partridge, M.D.     DATE OF BIRTH:  November 14, 1960   DATE OF ADMISSION:  02/06/2005  DATE OF DISCHARGE:  02/09/2005                                 DISCHARGE SUMMARY   DISCHARGE DIAGNOSES:  1.  Chronic obstructive pulmonary disease exacerbation/asthma exacerbation.  2.  Hypertension.  3.  Hyperglycemia.   HOSPITAL COURSE:  The patient presented to the emergency department with  increased shortness of breath for one day.  He states that he ran out of his  Advair and has since been experiencing increased shortness of breath and  dyspnea on exertion.   PHYSICAL EXAMINATION:  VITAL SIGNS:  The patient had a temperature of 98.5  with a blood pressure of 169/110, a pulse of 99-112, and a respiratory rate  of 25.  He was saturating 100% on 4 L via nasal cannula.  CARDIOVASCULAR:  He had a regular rate and rhythm with no murmurs, rubs or  gallops.  LUNGS:  He had supraclavicular retraction with diffuse inspiratory and  expiratory wheezes.  EXTREMITIES:  No signs of cyanosis, clubbing or edema.   The rest of the physical exam was unremarkable.   His labs upon admission were a sodium of 140, potassium of 4, chloride of  107, bicarb of 24, BUN 5, glucose 107.  Hemoglobin 15, hematocrit 44.  A  venous blood gas showed a pH of 7.376, PCO2 of 41.7, bicarb of 24.4.  A  chest x-ray showed hyperinflation with flattened diaphragms, no acute  infiltrates.   HOSPITAL COURSE:  Problem 1.  CHRONIC OBSTRUCTIVE PULMONARY DISEASE/ASTHMA EXACERBATION:  The  patient was admitted to the floor and started on albuterol and Atrovent  nebulizers q.2h. with q.1h. p.r.n.  He was also started on prednisone and  doxycycline.  The patient was restarted on his Advair 500/50 mcg and  instructed that he should not allow  himself to run out of his necessary  medications.  He improved while in the hospital, spacing out his nebulizer  treatments to q.4h., and improved his peak flow readings until he was at the  value which he was normally recording prior to hospitalization.  The patient  will continue on his doxycycline for a total of seven days, his prednisone  for a total of seven days, his albuterol MDI every four hours as needed, and  his Advair one puff twice daily, with instructions to follow up with his  primary care physician within one week.   Problem 2.  HYPERTENSION:  The patient was admitted with a known history of  hypertension, for which he was controlling with Diovan 160 mg daily.  His  pressures while in the hospital were uncontrolled and it was necessary to  add hydrochlorothiazide 12.5 mg to his daily regimen.  He will be discharged  on both his Diovan and his hydrochlorothiazide, and he was instructed to  again follow up with his  doctor within one week to achieve better blood  pressure control.   Problem 3.  HYPERGLYCEMIA:  The patient on admission was found to have two  glucose values greater than 100; however, this was thought to be secondary  to his steroids and his acute possibly infectious process.  His discharge  labs on February 09, 2005, showed that his hyperglycemia had, in fact, resolved.  He has no history of diabetes and should be followed as an outpatient.   DISCHARGE MEDICATIONS:  1.  Doxycycline 100 mg one tablet b.i.d.  2.  Prednisone 40 mg b.i.d. x7 days.  3.  Advair 500/50 mcg one puff b.i.d.  4.  Diovan 160 mg daily.  5.  Hydrochlorothiazide 12.5 mg daily.  6.  Albuterol MDI two puffs q.4h. p.r.n. wheezing.   DISCHARGE LABORATORY DATA:  The patient's __________ labs were drawn on February 09, 2005:  Sodium of 135, potassium 3.4, chloride 102, bicarb 26, BUN 3,  creatinine 0.8, glucose 78, calcium 8.9.  WBC 8.9, hemoglobin 13.5,  hematocrit 40.2, platelets of 308,000.    FOLLOW-UP ITEMS:  The patient was instructed to follow up at [facility]  within the week of discharge.  The patient was instructed to take all  medications as directed, including his doxycycline for seven days and his  prednisone for seven days.       KT/MEDQ  D:  02/15/2005  T:  02/16/2005  Job:  [id number]   cc:   [facility]

## 2010-12-02 NOTE — H&P (Signed)
[place]  Patient:    [name], AHN Visit Number: [id number] MRN: [id number]          Service Type: MED Location: [phone] Attending Physician:  Katy Apo. Dictated by:   Renford Dills, M.D. Admit Date:  06/07/2001                           History and Physical  CHIEF COMPLAINT: Short of breath, cough.  HISTORY OF PRESENT ILLNESS: This is a 50 year old black male, with known history of asthma, COPD, high blood pressure, arthritis, and tobacco abuse, who presents to the ED with a four day history of shortness of breath, productive cough with yellow sputum, associated with wheeze which required increased inhaler use.  The patient denied any sick contacts.  No tobacco use x 4 months, but does admit to being in a smoking environment recently.  The patient did see his primary M.D. but presented to the ED with the above complaints.  In the ED the patient was evaluated and had a chest x-ray which showed hyperinflated lung fields, questionable infiltrate in the right lung, left upper lobe bulla.  No other laboratory work was done.  The patient had 94% O2 saturation on 2 liters and was markedly hypertensive without anything done, systolics greater than 200.  Medicine was called for further evaluation and treatment.  PAST MEDICAL HISTORY:  1. As stated above, asthma x 3 years.  2. High blood pressure.  3. Significant tobacco use.  4. Does admit to having rheumatoid arthritis.  The patient denied any diabetes.  No MI.  No CVA.  Does not know his cholesterol level.  ADMISSION MEDICATIONS:  1. Advair.  2. Singulair.  3. Clarinex.  4. Serevent.  5. Albuterol.  6. Diovan.  The patient has been without his medicine for arthritis for over a year and without medicine for his blood pressure for greater than one to two months.  SOCIAL HISTORY: Negative for tobacco x 4 months, prior to that one pack per week for greater than 28 years.   The patient does admit to drinking alcohol on a regular basis.  Denies any history of DTs.  Denies any drug use recently but does admit to marijuana use in the past.  PAST SURGICAL HISTORY: Negative.  ALLERGIES: No known drug allergies.  FAMILY HISTORY: Noncontributory.  REVIEW OF SYSTEMS: As stated in the initial H&P.  The patient denies any fever or chills.  No headache.  No chest pain.  No hematuria.  No change in stools. No change in weight.  PHYSICAL EXAMINATION:  GENERAL: The patient was in moderate respiratory distress.  VITAL SIGNS: BP 180/114, pulse 114.  O2 saturation 96% on 2 liters.  HEENT: Muddy sclerae.  No oral lesions.  No nodes.  CHEST: Bilateral wheeze.  CARDIOVASCULAR: Tachy.  ABDOMEN: Positive bowel sounds.  Nontender.  EXTREMITIES: The patient has synovitis in bilateral wrists and into the left knee.  Lower extremities negative for any edema.  The patient did have an Ace wrap around his left knee, which was removed, and further examination negative for Homans sign, no calf pain.  The patient had significant osteoarthritis in his left knee.  RECTAL: No mass.  Prostate within normal limits.  Trace heme positive.  NEUROLOGIC: Examination nonfocal.  LABORATORY DATA: Chest x-ray, hyperinflated lung fields, questionable infiltrate in the right lobe, positive bulla in left upper lobe.  ABG done, pH 7.37, pCO2  41, pO2 68, saturation 93%.  ASSESSMENT/PLAN:  1. Chronic obstructive pulmonary disease asthmatic exacerbation in smoker.     Must also rule out pneumonia.  The patient will receive oxygen and     nebulizer treatments, steroids, and antibiotics.  Will repeated a chest     x-ray, PA and lateral were obtained.  Peak flows pre and post nebulizer     treatments.  Recommend the patient stop smoking.  2. High blood pressure, uncontrolled, probably secondary to the patients     noncompliance with medications.  Initiate nitro paste at this time and      start oral medication for and titrate for blood pressure control.  3. Rheumatoid arthritis.  Analgesics as needed.  4. Trace heme positive stool.  This is probably secondary to stress     gastritis.  Will obtain baseline CBC.  Will start Proton pump inhibitor.     Will make further recommendations after review of the above studies. Dictated by:   Renford Dills, M.D. Attending Physician:  Renford Dills D. DD:  06/07/01 TD:  06/08/01 Job: [id number]

## 2010-12-02 NOTE — Discharge Summary (Signed)
NAME:  [name], [name]                           ACCOUNT NO.:  [id number]   MEDICAL RECORD NO.:  [id number]                   PATIENT TYPE:  INP   LOCATION:  5506                                 FACILITY:  [facility]   PHYSICIAN:  Alvester Morin, M.D.               DATE OF BIRTH:  [birth year]   DATE OF ADMISSION:  05/11/2002  DATE OF DISCHARGE:  05/12/2002                                 DISCHARGE SUMMARY   SERVICE:  Internal Medicine Teaching Service B.   RESIDENT PHYSICIAN:  Dr. Blanch Media.   DISCHARGE DIAGNOSES:  1. Chronic obstructive pulmonary disease exacerbation.  2. Hypertension.  3. Polysubstance abuse.  4. Rheumatoid arthritis.   DISCHARGE MEDICATIONS:  1. Advair 250/500 one puff b.i.d.  2. Albuterol nebulizer q.6h. p.r.n.  3. Prednisone 40 mg one tab p.o. every day x9 days.  4. Azithromycin 250 mg one tab p.o. x4 days.  5. Diovan 80 mg one tab p.o. every day  6. Arthritic medication as prescribed by Dr. Maurice March.  7. Singulair.   DISPOSITION:  The patient is discharged home in stable condition.   FOLLOWUP:  Follow up with Dr. Audria Nine at [facility] at previously  arranged appointment.   BRIEF ADMISSION HISTORY:  The patient is a 50 year old male with a history  of COPD and hypertension, who has noted increasing shortness of breath and  wheezing since running out of his Advair Diskus on October 23rd.  He was  diagnosed with COPD seven years prior and has been treated with Advair,  albuterol, Atrovent and Singulair.  He has a 12-pack-year smoking history  and quit 8 months ago.  He has noted increased cough x4 days, no change in  his sputum production or color, no recent infection, no temperature, chills  or sweats.  He was last seen by his primary care physician on September  18th.  He states that cold weather is also a stimulant for his COPD  exacerbations.   PHYSICAL EXAMINATION:  Pertinent findings on physical examination included  temperature of 96.1, a heart rate of 112, respiratory rate of 18, blood  pressure of 210/124, O2 saturation of 96% on room air.  Generally, the  patient was lying flat on the stretcher, breathing comfortably.  Mucous  membranes were pink and moist.  Nares were patent.  Oropharynx was within  normal limits.  Respiratory had decreased air exchange, significant wheeze  that was diffuse, no crackle or rhonchi appreciated.  No use of accessory  muscles.  No intercostal retractions.  CARDIOVASCULAR:  No heave, no thrill.  PMI not displaced.  S1 and S2.  Regular rate and rhythm.  No ectopy.  Radial, distal and pedal pulses 2/2 bilaterally.   HOSPITAL COURSE:  The patient was admitted for a COPD exacerbation.   1. CHRONIC OBSTRUCTIVE PULMONARY DISEASE EXACERBATION:  Chest x-ray showed     severe COPD, no acute disease  and was relatively unchanged from August     2003.  ABG showed pH of 7.37, PACO2 of 36, PAO2 of 77 and bicarb of 21.     CBC and differential showed a white count 6.9, hemoglobin of 13.5, and     ANC of 5.9.  The patient was given Solu-Medrol, Tequin and scheduled     albuterol and Atrovent nebulizer treatments.  Overnight, the patient     improved significantly.  His airways appeared less congested.  There was     significantly decreased wheeze and much improved air exchange.  The     patient was consulted on the importance of medication compliance.   1. HYPERTENSION:  Diovan 80 mg was continued throughout the patient's     admission.  He was consulted on the relationship of cocaine abuse and     uncontrolled blood pressure.  Of note, the patient had a 2-D echo in     August of 2003 that showed an ejection fraction of 55-65% and mild     asymmetric septal hypertrophy.   1. RHEUMATOID ARTHRITIS:  Stable and the patient is to resume his     medications on discharge home.   1. HISTORY OF POLYSUBSTANCE ABUSE:  The patient's UDS was positive for     cocaine.  The patient drinks beer  regularly, about a 12-pack a week.  The     patient was counseled on the importance of abstinence from cocaine and     its drug relation to his hypertension.  He was encouraged to continue his     abstinence from smoking.     Barney Drain, M.D.                       Alvester Morin, M.D.    SS/MEDQ  D:  05/12/2002  T:  05/13/2002  Job:  [id number]   cc:   Maurice March, M.D.

## 2010-12-02 NOTE — Discharge Summary (Signed)
NAMEKERRI, [name] NO.:  [id number]   MEDICAL RECORD NO.:  [id number]          PATIENT TYPE:  INP   LOCATION:  3021                         FACILITY:  [facility]   PHYSICIAN:  Melina Fiddler, MD DATE OF BIRTH:  May 01, 1961   DATE OF ADMISSION:  08/02/2005  DATE OF DISCHARGE:  08/04/2005                                 DISCHARGE SUMMARY   FINAL DIAGNOSIS:  1.  Chronic obstructive pulmonary disease exacerbation.  2.  Hypertension.  3.  Asthma.  4.  History of poly-substance abuse.  5.  Rheumatoid arthritis.  6.  Allergic rhinitis.  7.  History of gastroesophageal reflux disease induced hyperglycemia.   LABORATORY DATA:  None.   PROCEDURES:  The patient had a chest x-ray on August 02, 2005, which showed  stable, severe changes of emphysema, worse in the left lung with suspected  pulmonary vascular shunting to the right, stable interstitial chronic  changes throughout the right lung, no superimposed acute air space  processes.   HOSPITAL COURSE:  This is a 50 year old male with a long history of smoking  who recently quit 4-5 months ago who comes in with increased shortness of  breath.   Problem 1:  COPD exacerbation.  The patient has not been able to afford his  medications for the last few days and he complained of increased sputum  production for one day, no fever.  The patient had three treatments of  Xopenex in the ER, his O2 saturation was 98%, however, he still felt he was  short of breath.  The patient was admitted, had O2 saturations, the lowest  it went was 93%.  On the day of discharge, he walked quite far without  increased shortness of breath.  The day before discharge, he walked and had  O2 saturations of 95-96%.  The patient was started on Doxycycline for COPD  exacerbation and prednisone.  He was continued on his home Albuterol and  Atrovent.  The patient, at home, takes Advair 250/50 plus Serevent, we  switched him to Advair 500/50 while  here.  His primary MD may be less cost  to the patient and safer if he was continued on Advair 500/50 instead of  Advair 250/50 plus Serevent.  The patient was given his MDIs; after Atrovent  and Albuterol, Advair at discharge.   Problem 2:  Hypertension.  The patient had a few elevated blood pressures  while here because he has not been taking his Avapro.  He was started back  on Avapro and given one pill of hydrochlorothiazide to cover, his blood  pressures came down and were stable at discharge.   Problem 3:  History of poly-substance abuse.  The patient reported no  problems at this time.   DISCHARGE MEDICATIONS:  1.  Advair 500/50, 1 puff p.o. b.i.d.  2.  Stop Serevent.  3.  Diovan 160 mg p.o. daily.  4.  Atrovent 2 puffs every 6 hours or four times a day.  5.  Albuterol 2 puffs as needed every 6 hours.  6.  Doxycycline 100 mg take twice  daily for three more days.  7.  Prednisone 40 mg take daily for three more days.   The patient was a full code during his hospital stay here.      Rolm Gala, M.D.    ______________________________  Melina Fiddler, MD    HG/MEDQ  D:  08/04/2005  T:  08/04/2005  Job:  [id number]

## 2010-12-02 NOTE — Discharge Summary (Signed)
[name], [name]                 ACCOUNT NO.:  [id number]   MEDICAL RECORD NO.:  [id number]          PATIENT TYPE:  INP   LOCATION:  5730                         FACILITY:  [facility]   PHYSICIAN:  Harrie Jeans, M.D.     DATE OF BIRTH:  [birth year]   DATE OF ADMISSION:  05/01/2004  DATE OF DISCHARGE:  05/03/2004                                 DISCHARGE SUMMARY   DISCHARGE DIAGNOSES:  1.  Chronic obstructive pulmonary disease exacerbation.  2.  Hypertension.  3.  Cocaine abuse.  4.  Rheumatoid arthritis.   DISCHARGE MEDICATIONS:  1.  Prinzide 12.5/10 mg once daily.  2.  Spiriva handy-inhaler one cap INH daily.  3.  Advair discus 500/50 mg, one INH b.i.d.  4.  Albuterol inhaler p.r.n. wheezing.  5.  Prednisone taper.   DISPOSITION:  The patient is discharged to home.   FOLLOWUP:  Will follow up with Dr. Maurice March at [facility] on  Friday, May 20, 2004, at 8:45 a.m.  The patient expressed an interest in  transferring care to [facility], and  will work on setting that up after discharge.   PROCEDURE:  None.   STUDIES:  A chest x-ray on May 01, 2004, showed no infiltrate, stable.  Left upper lobe scarring.  A CT scan of the chest on May 01, 2004, showed no evidence of pulmonary  thromboembolism.  Fibrosis and emphysema throughout both lungs.  More  consolidated density at the left apex, which may simply be related to old  granulomatous disease.  A follow-up CT in three to six months is recommended  to ensure stability of this area, as no prior CT scans are available at this  time for a comparison.   HISTORY OF PRESENT ILLNESS:  The patient is a 50 year old African/American  male with a past medical history of chronic obstructive pulmonary disease,  who woke up on the morning of the day of admission feeling unwell.  He  states he had felt fine on the night before.  He endorsed a cough, which is  productive of yellow  sputum.  He denied fever, chills, sick contacts, travel  recently, or medication noncompliance.  He has a history of multiple  admissions and emergency room visits over the past three years, with  respiratory distress, but no history of prior intubation.   PHYSICAL EXAMINATION:  VITAL SIGNS:  Temperature 98.6 degrees, pulse 106,  blood pressure 255/156, breathing 40.  Saturation 100% on room air.  GENERAL:  He is a thin African/American male in respiratory distress.  Clearly using his accessory muscles to breathe.  HEENT:  Eyes are slightly yellowed.  Pinpoint pupils.  Extraocular muscles  intact.  ENT:  Moist mucous membranes.  No erythema or exudates.  NECK:  Supple without lymphadenopathy or jugular venous distention.  LUNGS:  Diffuse inspiratory and expiratory wheezes with increase E:I ratio.  Decreased breath sounds on the left, with E:A changes, and using abdominal  muscles to breathe.  CARDIOVASCULAR:  A tachycardia but regular rhythm.  No murmurs, rubs, or  gallops  heard.  ABDOMEN:  The abdomen was tense with breathing effort, but was otherwise  nontender, nondistended with positive bowel sounds.  EXTREMITIES:  No edema.  SKIN:  Warm and dry.  MUSCULOSKELETAL:  Bony enlargement of the right wrist, left knee and  bilateral MCPs, but no ulnar deviation.  NEUROLOGIC:  The patient was sleepy, having received Ativan prior to the  examination.  His strength is 5/5 throughout.  He has 2+ deep tendon  reflexes.   LABORATORY DATA:  ABG:  The pH of 7.167, 79.3, 180, 28.7, 99%.  Sodium 136,  potassium 4.0, chloride 106, bicarbonate 29.5, BUN 10, creatinine 0.9,  glucose 177.  White blood cells 14.3, hemoglobin 15.6, hematocrit 46.0,  platelets 319, ANC 94, total lymphs 30%.   HOSPITAL COURSE:  #1 - CHRONIC OBSTRUCTIVE PULMONARY DISEASE EXACERBATION:  The patient was admitted to a regular bed, as repeat ABG showed improvement  of the respiratory acidosis which was thought to be  secondary to the Ativan  that he had received in the emergency room.  The patient was given 125 mg of  methylprednisolone in the emergency room which was continued q.6h. until the  second day of admission when he was switched to prednisone 60 mg p.o. with  dramatic clinical improvement overnight.  Advair, Spiriva and albuterol  nebulizers were given.  Because of the sudden onset of shortness of breath  on the day of admission a CT scan to rule out pulmonary embolus was ordered,  and came back negative.  The patient is discharged home with a steroid  taper, and will follow up with his primary M.D. in early November.  #2 - HYPERTENSION:  A blood pressure of 255/156 on admission was thought to  be secondary to anxiety from respiratory distress, as well as cocaine use,  as the urine screen drug test was positive for cocaine.  The patient was  initially put on a nitroglycerin drip in the emergency room but this was  discontinued once he was admitted to the floor, and his blood pressures were  stable on hydrochlorothiazide 25 mg daily.  The patient was also started on  Lisinopril 5 mg daily.  #3 - RHEUMATOID ARTHRITIS:  The patient is currently asymptomatic and states  that he has been on sulfasalazine in the past, but has not been taking that  recently.  We are unclear as to why that is, and would encourage his primary  care physician to restart him on this medication or another medication, in  order to prevent future flares.  #4 - LEUKOCYTOSIS:  The patient was afebrile on admission, and had no  evidence of infiltrate on a chest x-ray.  Doxycycline 100 mg b.i.d. was  started for atypical pneumonia, but was not continued on discharge, because  of the patient's obvious clinical improvement on steroids.  The leukocytosis  was thought to be due primarily to the steroids received in-house.  An HIV  test was ordered, because the percentage of lymphocytes was noted to be low at 3.  The patient also  had a low albumin, concerning for possible  malnutrition or chronic infection.   DISCHARGE LABORATORY DATA:  Sodium 138, potassium 3.8, chloride 103,  bicarbonate 28, glucose 143, BUN 9, creatinine 0.8, calcium 9.  WBC 15.9,  hemoglobin 12.2, hematocrit 35.7, platelets 285.  An HIV test was performed,  which was negative.  C-reactive protein 13.2, ESR 12.  Albumin 3.2, total  protein 6.4, AST 26, ALT 19, alkaline phosphatase 79.  JH/MEDQ  D:  05/03/2004  T:  05/03/2004  Job:  [id number]

## 2010-12-02 NOTE — Discharge Summary (Signed)
NAME:  Paul, [name] NO.:  [id number]   MEDICAL RECORD NO.:  [id number]          PATIENT TYPE:  OBV   LOCATION:  1823                         FACILITY:  [facility]   PHYSICIAN:  Theone Stanley, MD   DATE OF BIRTH:  1961/01/19   DATE OF ADMISSION:  03/21/2005  DATE OF DISCHARGE:                                 DISCHARGE SUMMARY   CHIEF COMPLAINT:  Shortness of breath.   HISTORY OF PRESENT ILLNESS:  [name] is a 50 year old gentleman who has  frequent visits and admission secondary to his asthma.  The patient states  over the past four days he has increasing shortness of breath.  He had  increased his Albuterol treatments at q.3h.  Last night it got progressively  worse.  He increased it to two hours.  However, this did not help.  He  called EMS at that time and he came to the hospital.  The patient had  several treatments in the ER, but he continued to have wheezing.  It was  felt it would be best to admit him at that time.  The patient said that he  does have a dry cough, although, this is not new.  He has never been  intubated for his asthma.  When asked about increased exposure, he does say  that he has had increased exposure to cats.  It is unclear whether this  might be causing exacerbation.  He stopped smoking about two months ago.   PAST MEDICAL HISTORY:  1.  Asthma.  2.  Hypertension.   MEDICATIONS:  1.  Advair 250/50 b.i.d.  The patient says he ran out of his medication      yesterday.  2.  Diovan 160 one p.o. daily.  3.  Nebulizers Albuterol and Atrovent.   ALLERGIES:  None.   PAST SURGICAL HISTORY:  None.   FAMILY HISTORY:  Hypertension.   SOCIAL HISTORY:  The patient lives in [place], is on disability.  States  that he currently does not smoke.  However, he states that he smoked half  pack a day four years prior to that.  He drinks a case of beer a month.  He  denies drinking daily.   REVIEW OF SYSTEMS:  Please see HPI, otherwise, all  systems were negative.   PHYSICAL EXAMINATION:  VITAL SIGNS:  Temperature 98.3, blood pressure  191/124, repeat was 115/71, pulse 130, respiratory rate 36, repeat 28,  saturating 99% on 2 liters.  HEENT:  Normocephalic, atraumatic.  Eyes:  4 mm.  Pupils equal and reactive  to light.  Sclerae anicteric.  Throat clear.  no exudate.  NECK:  Supple.  No lymphadenopathy.  HEART:  Regular rate and rhythm.  No murmurs, rubs or gallops appreciated.  LUNGS:  Expiratory wheezes bilaterally.  The patient did have rhonchi at the  right lower base.  ABDOMEN:  Soft, nontender, nondistended.  EXTREMITIES:  No cyanosis, clubbing or edema.   LABORATORY DATA:  Radiology:  Chest x-ray showed possibility of infiltrate  on right side with evidence of COPD.  Gas showed pH of  7.32, CO2 48, O2 73.  Sodium 135, potassium 2.8, chloride 105, BUN 7, glucose 132.  White count 9,  hemoglobin 14, hematocrit 43, platelets 389,000.   ASSESSMENT/PLAN:  A 50 year old gentleman presenting with an asthma/chronic  obstructive pulmonary disease exacerbation.  1.  Asthma/chronic obstructive pulmonary disease exacerbation.  Appears      there is a possibility the patient may have a pneumonia on the right      side.  Will treat him.  As such, he will receive nebulizers, IV      steroids, IV antibiotics.  Placed on telemetry floor.  2.  Hypertension.  Continue with his Diovan.      Theone Stanley, MD  Electronically Signed     AEJ/MEDQ  D:  03/21/2005  T:  03/21/2005  Job:  [id number]   cc:   Maurice March, M.D.  [address] [place]  [place] [address]  Fax: [phone]

## 2010-12-02 NOTE — Discharge Summary (Signed)
[name], [name]                 ACCOUNT NO.:  [id number]   MEDICAL RECORD NO.:  [id number]          PATIENT TYPE:  INP   LOCATION:  4704                         FACILITY:  [facility]   PHYSICIAN:  Jackie Plum, M.D.DATE OF BIRTH:  05-30-1961   DATE OF ADMISSION:  11/09/2004  DATE OF DISCHARGE:  11/11/2004                                 DISCHARGE SUMMARY   DISCHARGE DIAGNOSIS:  1.  Acute bronchial asthma exacerbation, resolved.  2.  History of hypertension.  3.  History of previous poly-substance abuse.  4.  Hyperglycemia, likely steroid induced, mild, follow up at primary care      physician as an outpatient.   DISCHARGE MEDICATIONS:  Humibid LA 600 mg b.i.d., Combivent MDI 2 puffs  q.i.d., Zithromax 250 mg daily for two days, prednisone taper, Advair 500/50  1 puff daily, Diovan 150 mg daily.   DISCHARGE INSTRUCTIONS:  Diet low salt.  Follow up with PCP in 10-14 days,  she is to call for appointment.   DISCHARGE LABORATORY DATA:  WBC 10.6, hemoglobin 12.4, hematocrit 37.1, MCV  86.4, platelet count 480.  Sodium 134, potassium 4.2, chloride 106, CO2 25,  glucose 181, BUN 5, creatinine 0.6, calcium 8.5.   CONDITION ON DISCHARGE:  Improved and satisfactory.   CONSULTATIONS:  Coletta Memos, M.D.   REASON FOR ADMISSION:  Bronchial asthma exacerbation.   HISTORY:  The patient presented with dyspnea and cough.  Admitting x-ray was  negative for any acute infiltrate.  EKG was negative for any acute ST wave  changes.  He had wheezes with a temperature of 98.5.  He was, therefore,  admitted for management of this problem.  Please see admission H&P dictated  by me on November 09, 2004, for full details regarding the patient's  presentation.   HOSPITAL COURSE:  The patient was admitted to hospitalist service to  telemetry monitoring.  There were no significant arrhythmias.  He was  started on Zithromax for presumed bronchitis as the exciting factor.  He  received bronchodilator  nebulizations, supplemental oxygen, and steroids  with improvement in the patient's respiratory status.  On rounds today, the  patient is better, no chest pain, shortness of breath, fever or chills.  His  O2 saturation on room air was 97%.  His lungs revealed mild wheezes on exam.  Cardiac regular rate and rhythm with no gallops.  Abdomen soft and  nontender.  He was alert and oriented x 3 with no acute focal deficit.  Discharge blood pressure 138/74, pulse 89, temperature 97.4.  The patient  had mentioned to me that he had stopped smoking cigarettes a couple of  months previously.  I realized that he had given this same history years  back when he came for admission.  I mentioned to him that he should continue to refrain from smoking  cigarettes and he expressed understanding.  He will need to continue  outpatient reinforcement on this issue during outpatient follow up.  The  patient is discharged home in stable, satisfactory condition.      GO/MEDQ  D:  11/11/2004  T:  11/11/2004  Job:  [id number]

## 2010-12-02 NOTE — Discharge Summary (Signed)
NAME:  [name], [name]                           ACCOUNT NO.:  [id number]   MEDICAL RECORD NO.:  [id number]                   PATIENT TYPE:  INP   LOCATION:  3731                                 FACILITY:  [facility]   PHYSICIAN:  Hollice Espy, M.D.            DATE OF BIRTH:  Feb 07, 1961   DATE OF ADMISSION:  09/13/2003  DATE OF DISCHARGE:  09/17/2003                                 DISCHARGE SUMMARY   DISCHARGE DIAGNOSES:  1. Asthma exacerbation.  2. Hypertension.  3. Noncardiac related increase in troponin.   DISCHARGE MEDICATIONS:  1. Diovan.  The patient did not know dose; and, I believe, he is on 80 mg     p.o. daily.  2. Prednisone taper for the next 7 days.  3. Tussionex 5 cc p.o. q.12h. p.r.n.  4. Albuterol MDI 2 puffs 4 times a day.  5. Singulair 10 mg p.o. daily.  6. Avelox 400 mg p.o. x3 days.   HOSPITAL COURSE:  This is a 50 year old African-American male with a history  of asthma and hypertension. He presented with an asthma exacerbation on  October 12, 2003.  He had been having increased shortness of breath for the  past 3 days and still had a cough with whitish sputum.  Chest x-ray showed a  questionable infiltrate.  The patient had a low-grade temperature and an  elevated white count. He was started on IV Avelox, steroids and breathing  treatments.  He responded well feeling less short of breath on the second  day. He was changed over from IV Solu-Medrol to tapering dose and changed  over to oral prednisone on September 16, 2003.  He continued to complain of cough  despite being on Humibid.  He was started on Tussionex which he seemed to  tolerate much better and had less of a cough.  By the morning of March 3 he  was doing much better having no shortness of breath.  His lung exam was  greatly improved with decreased wheezing and he was off oxygen maintaining  his STATs on room air.  His white count remained stable.  He was afebrile  and he was tolerating p.o.  He was  felt to be medically stable for discharge  on September 17, 2003.   In regards to his hypertension, it was stable during his hospital course.  The patient is being discharged to home.  His disposition is improved.  He  will be followed up with Dow Adolph his PCP on a scheduled  appointment on October 01, 2003.   Activity as tolerated, although he is advised to avoid the cold.  He will  continue a prednisone taper as well as his other medications.  Prescriptions  for all his medicines have been given.  Hollice Espy, M.D.    SKK/MEDQ  D:  09/17/2003  T:  09/18/2003  Job:  [id number]   cc:   Maurice March, M.D.  [address] [place]  [place] [address]  Fax: [phone]

## 2010-12-02 NOTE — H&P (Signed)
NAMEAZAEL, RAGAIN NO.:  [id number]   MEDICAL RECORD NO.:  [id number]          PATIENT TYPE:  INP   LOCATION:  1823                         FACILITY:  [facility]   PHYSICIAN:  Hillery Aldo, M.D.   DATE OF BIRTH:  Mar 04, 1961   DATE OF ADMISSION:  06/11/2006  DATE OF DISCHARGE:                                HISTORY & PHYSICAL   PRIMARY CARE PHYSICIAN:  Dr. Dow Adolph at [facility]   CHIEF COMPLAINT:  Dyspnea.   HISTORY OF PRESENT ILLNESS:  The patient is a 50 year old male with past  medical history of multiple hospitalizations for acute asthma exacerbation  secondary to medical noncompliance, who presents with a 1-week history of  increased wheezing, dyspnea and cough productive of yellow sputum in the  past several days.  He denies any associated fever or chills.  He denies any  chest pain.  The patient states he ran out of his Advair and albuterol  approximately 3 days ago.  He has noticed that he is more fatigued and  lethargic than usual as well.   PAST MEDICAL HISTORY:  1. Asthma.  2. Hypertension.  3. Polysubstance use.  4. Medical non-adherence.  5. Allergic rhinitis.  6. Steroid-induced hyperglycemia.  7. Rheumatoid arthritis.   FAMILY HISTORY:  The patient's father died of an acute MI at age 11.  The  patient's mother is alive and healthy at age 19.  The patient does not have  any siblings.  He has 3 healthy offspring.   SOCIAL HISTORY:  The patient is married and lives with his wife.  He has a  longstanding history of tobacco use, smoked about a pack per week and states  he quit 2 months ago.  A review of his medical records indicate that every  time he comes in, he makes similar statements about having quit.  He denies  any recent alcohol or drug use.  He has a past history of crack cocaine,  marijuana and other drug use.   ALLERGIES:  The patient denies any allergies, but according to an old  record, he is allergic to SULFA and  CODEINE.   MEDICATIONS:  The patient is currently not taking any, but should be on:  1. Advair Diskus 500/50 one inhalation b.i.d.  2. Albuterol metered-dose inhaler two puffs q.2 h. p.r.n.  3. Diovan 160 mg daily.   REVIEW OF SYSTEMS:  As per HPI, otherwise negative.  Specifically, the  patient denies any bowel or bladder dysfunction.  He does have some chronic  arthralgias which he states are secondary to his history of rheumatoid  arthritis.  Right now, he is complaining of leg cramps, otherwise no other  complaints.   PHYSICAL EXAM:  VITAL SIGNS:  Temperature 98, pulse 123, respirations 26,  blood pressure 170/97, O2 saturation 94% on room air.  GENERAL:  He is a dyspneic young man in no acute distress.  HEENT:  Normocephalic, atraumatic.  PERRL.  EOMI.  Sclerae are slightly  icteric and muddy-looking.  Oropharynx is clear.  NECK:  Supple, no thyromegaly, no lymphadenopathy, no jugular venous  distension.  CHEST:  There are diffuse expiratory wheezes with poor air movement.  HEART:  Tachycardic rate, regular rhythm.  No murmurs, rubs, or gallops.  ABDOMEN:  Soft, nontender, nondistended with normoactive bowel sounds.  EXTREMITIES:  No clubbing, edema, or cyanosis.  SKIN:  Warm and dry.  No rashes.  NEUROLOGIC:  The patient is alert x3.  Cranial nerves II-XII grossly intact.  Nonfocal.   DATA REVIEW:  Chest x-ray:  No formal reading available by the radiologist,  but by my review, the patient has a right lower lobe infiltrate and bullous  emphysematous changes.   LABORATORY DATA:  Sodium is 136, potassium 3.9, chloride 103, bicarb 25, BUN  9, creatinine 1.0, glucose 180.  Hemoglobin 15.3, hematocrit 45.   ASSESSMENT AND PLAN:  1. Status asthmaticus secondary to lack of medical appearance and possible      superimposed infection:  We will admit the patient for empiric      treatment with broad-spectrum antibiotic, Solu-Medrol and nebulized      bronchodilator therapy.  In  addition, I will attempt to get sputum      cultures.  We will check a formal CBC with differential to determine if      he has leukocytosis or a left shift.  We will administer Mucinex to      help him clear his secretions and Robitussin p.r.n. for cough.  Given      his ongoing chest tightness despite having received prednisone in the      emergency department and several bronchodilator treatments, would      administer 2 g of magnesium sulfate and also start the patient on      Singulair.  2. Hypertension:  The patient has not been taking any antihypertensive      medications.  He is currently hypertensive.  I will restart his Diovan      at his usual dose, since this has been effective in getting his blood      pressure to goal, and will consider titrating this up versus adding a      second agent.  Compliance will likely be a long-term issue with the      patient.  3. Ongoing intermittent tobacco abuse:  We will get a formal tobacco      cessation counselor to speak with the patient about the importance of      tobacco cessation or optimal disease management.  4. Polysubstance abuse history:  We will check a urine drug screen to      ensure that this is no longer an active issue, especially given his      ongoing non-adherence to medical management.  5. Steroid-induced hyperglycemia:  We will place the patient on sliding-      scale insulin.  6. Prophylaxis:  We will initiate gastrointestinal prophylaxis with      Protonix and use PAS hose for deep venous thrombosis prophylaxis until      the patient is fully ambulatory.      Hillery Aldo, M.D.  Electronically Signed     CR/MEDQ  D:  06/11/2006  T:  06/11/2006  Job:  [id number]   cc:   Maurice March, M.D.

## 2010-12-02 NOTE — H&P (Signed)
[name], [name]                 ACCOUNT NO.:  [id number]   MEDICAL RECORD NO.:  [id number]          PATIENT TYPE:  INP   LOCATION:  3713                         FACILITY:  [facility]   PHYSICIAN:  Lonia Blood, M.D.      DATE OF BIRTH:  26-Aug-1960   DATE OF ADMISSION:  04/06/2005  DATE OF DISCHARGE:                                HISTORY & PHYSICAL   PRIMARY CARE PHYSICIAN:  Nurse, [facility], Dr. Dow Adolph.   PRESENTING COMPLAINT:  Increasing shortness of breath.   HISTORY OF PRESENT ILLNESS:  The patient is a 50 year old African-American  male with multiple admissions of hospital visits secondary to persistent  asthmatic attack. The patient came in today secondary to worsening shortness  of breath that has progressively been getting worse since he left the  hospital over 2 weeks ago. During that hospitalization he was found to have  having an asthmatic attack and was discharged on some albuterol and Atrovent  nebulizers, as well as Advair Diskus. The patient came in severely wheezing,  very tachypneic, and having poor air entry bilaterally. He was treated in  the ED but failed to completely resolve. He is also presumed to have  pneumonia which may be a trigger; hence, this admission. The patient denied  any other possible triggers.   PAST MEDICAL HISTORY:  1.  Acute asthmatic attack.  2.  Hypertension.  3.  Polysubstance abuse.  4.  History of rheumatoid arthritis before.  5.  Medication noncompliance.  6.  Possible pneumonia previous admission.   ALLERGIES:  The patient has no known drug allergies.   MEDICATIONS:  He is supposed to be on:  1.  Advair 250/50 one puff b.i.d.  2.  Diovan 160 mg p.o. daily.  3.  Albuterol and Atrovent nebulizers.   However, the patient has not been compliant with his medications secondary  to high co-pay, which is $9 per medication.   SOCIAL HISTORY:  The patient lives in [place] alone and is apparently on  disability. Denied  any tobacco or alcohol use. He quit a couple of years  ago. He drinks alcohol sparingly, but currently denied any recently.   FAMILY HISTORY:  Significant for hypertension. His dad died from MI at the  age of 58. His mom is alive and healthy. The patient has three grownup  children - 17, 18, and 21.   REVIEW OF SYSTEMS:  Ten-point review of systems is essentially as in HPI.  The patient also complained of some chills and shaking and generalized  weakness.   PHYSICAL EXAMINATION:  VITAL SIGNS:  His temperature was 99.3, blood  pressure is 188/125, with a pulse of 130, respiratory rate is 36,  saturations 96% on 15 L. Currently, temperature 98, blood pressure 112/57,  pulse 94, and respiratory rate of 18, with saturations 95% on 2 L.  GENERAL:  The patient looks very anxious, worried, and tachypneic, in mild  respiratory distress.  HEENT:  PERRL, EOMI. Dry mucous membranes.  NECK:  Supple. No JVD, no lymphadenopathy.  RESPIRATORY:  He has labored breathing, poor air entry bilaterally,  with  extensive expiratory wheezes also bilaterally. There is some right lower  lung basal crackles as well.  CARDIOVASCULAR:  The patient is fairly tachycardic.  ABDOMEN:  Soft, nontender, with positive bowel sounds.  EXTREMITIES:  Show no edema, cyanosis, or clubbing.   LABORATORY:  Sodium 133, potassium 4.0, chloride 103, CO2 23, glucose 112,  BUN 12, creatinine 1.0, calcium 8.6. White count 18,000; hemoglobin 13.7;  platelet count 331; with a left shift; ANC of 14. BNP is 31. Cardiac enzymes  initially negative. His ABGs showed a pH of 7.300, PCO2 53, PO2 91, bicarb  26, with saturations of 96%. Myoglobin is elevated but troponin is negative.  Chest x-ray showed possible right-sided infiltrates versus pulmonary edema  on the right. EKG shows sinus tachycardia with a rate of 119. There is some  evidence of left ventricular hypertrophy and possible nonspecific ST  changes. EKG is unchanged from  previous EKG in June 2006.   ASSESSMENT:  Therefore, this is a 50 year old male with known history of  asthma and chronic obstructive pulmonary disease presenting with chronic  obstructive pulmonary disease attack, probably triggered by pneumonia.   PLAN:  1.  Asthma/COPD exacerbation. Will admit the patient for inpatient      treatment. We will start IV Solu-Medrol schedule, as well as albuterol      and Atrovent nebulizers. I will start the patient on empiric antibiotic      therapy while getting blood cultures and sputum cultures. We will also      continue with oxygen to keep his saturations between 90% and 93%.  2.  Pneumonia. Most likely pneumonia versus edema. However, with a BNP of      only 31, the chances of this being edema is less likely. Will therefore      treat it as a case of pneumonia, probably atypical. I will start him on      some empiric quinolone. If there is no improvement, we will switch to      more broader coverage. At this time, however, I suspect community-      acquired pneumonia.  3.  Hypertension. I will continue with his Diovan from home.  4.  Polysubstance abuse. The patient is said to have quit. I will check      urine drug screen and follow the patient closely in the hospital. I will      continue with thiamine and folate because of his prior alcohol intake      and watch for any signs of agitation.  5.  Medication noncompliance. I have discussed with the patient. It sounds      like the patient has not been getting his medication because of issues      of co-pay. I will have case manager review with the patient. His      frequent hospitalizations could definitely be prevented if the patient      has been compliant with his medications at time of discharges.      Lonia Blood, M.D.  Electronically Signed     LG/MEDQ  D:  04/06/2005  T:  04/06/2005  Job:  [id number]

## 2010-12-02 NOTE — Discharge Summary (Signed)
[place]  Patient:    ELOHIM, BRUNE Visit Number: [id number] MRN: [id number]          Service Type: MED Location: [id number] Attending Physician:  Katy Apo. Dictated by:   Renford Dills, M.D. Admit Date:  06/07/2001                             Discharge Summary  DISCHARGE DIAGNOSES: 1. Chronic obstructive pulmonary disease with asthmatic exacerbation. 2. High blood pressure. 3. Tobacco use. 4. History of alcohol use to excess. 5. Osteoarthritis. 6. Rheumatoid arthritis.  DISCHARGE MEDICATIONS: 1. Advair 250/50 one inhalation b.i.d. 2. Procardia XL 60 mg one daily. 3. Diovan 160 mg one daily. 4. Multivitamin one daily. 5. Albuterol MDI two puffs every four to six hours as needed for shortness of    breath and wheezing. 6. Prednisone 10 mg on a tapering basis over 10 days. 7. Axid 150 mg one every 12 hours. 8. Tequin 400 mg one daily x 5 days. 9. Singular 10 mg one daily.  LABORATORY DATA AND X-RAY FINDINGS:  Chest x-ray significant for emphysematous bullae in the left upper lobe, no pneumonia.  HISTORY OF PRESENT ILLNESS:  This is a 50 year old, African-American male known with asthma and COPD presented to the ED in respiratory distress secondary to his asthma and exacerbation.  The patient was evaluated and admission was deemed necessary for further evaluation and treatment.  Please see initial H&P for further discussion on the history of present illness.  PAST MEDICAL HISTORY:  As stated above.  MEDICATIONS:  As stated above.  SOCIAL HISTORY:  Uses alcohol and tobacco.  Denies any drugs.  ALLERGIES:  No known drug allergies.  FAMILY HISTORY:  Noncontributory.  HOSPITAL COURSE:  The patient was admitted to a monitored bed for severe COPD asthmatic exacerbation and for treatment for his uncontrolled hypertension. On presentation, as described in initial H&P, the patient had significant wheezing and long expiratory  wheezing with hypoxia secondary to his asthma. Also of note, his blood pressure was significantly elevated with systolics in the 200s on admission.  The patient received standard treatment for his COPD asthmatic exacerbation which included O2, antibiotics, nebulizers and steroids.  The patient had sputum cultures which grew out some gram-positive cocci in pairs.  For the patients high blood pressure, he had nitroglycerin paste for initial control and was started on p.o. medicine in the form of Procardia as well as his Diovan.  The patient had other screening blood work which was essentially normal.  He had a lipid panel with total cholesterol of 134, HDL 53.  The patient had a PSA of 0.4.  White count on admission was 11.1.  Throughout this admission there were no problems with fever or other complications.  The patients hospital course followed a fairly predicted course.  He had improvement in his air movement, decrease in his wheezing and increase in his functional capacity.  The patient had peak flows done throughout his hospitalization which were approximately 250 prenebulizer and postnebulizer treatment.  At this time, the patient is felt stable for discharge.  He will be discharged to home in the a.m. with outpatient followup at [facility] with Dr. Audria Nine on December 12.  SPECIAL INSTRUCTIONS:  The patient has been advised to refrain from smoking and refrain from putting himself in environments that would trigger his asthma.  Also, the patient is asked to refrain from consuming  alcohol. Dictated by:   Renford Dills, M.D. Attending Physician:  Renford Dills D. DD:  06/10/01 TD:  06/11/01 Job: [id number]

## 2010-12-02 NOTE — H&P (Signed)
NAME:  [name], [name] NO.:  [id number]   MEDICAL RECORD NO.:  [id number]          PATIENT TYPE:  INP   LOCATION:  1825                         FACILITY:  [facility]   PHYSICIAN:  [name], M.D.DATE OF BIRTH:  1960/11/16   DATE OF ADMISSION:  08/02/2005  DATE OF DISCHARGE:                                HISTORY & PHYSICAL   PRIMARY CARE PHYSICIAN:  [facility].   CHIEF COMPLAINT:  Dyspnea, increased wheezing.   HISTORY OF PRESENT ILLNESS:  [name] is a 50 year old African-American  male with history of asthma and hypertension who has increasing shortness of  breath and purulent sputum x1 day duration. The patient denies any fever. He  was treated in the ED with Xopenex nebulizers x3 and still feels that he has  shortness of breath. At baseline, he walks 14 steps without difficulty and  now can only walk three to four steps and has increased wheezing. He states  that he ran out of his Advair and Serevent one day ago. He has had no  intubations in the past for COPD. He denies any chest pain and was last on  prednisone for his breathing in September 2006.   PAST MEDICAL HISTORY:  1.  Significant for hypertension.  2.  Polysubstance abuse.  3.  Rheumatoid arthritis.  4.  Blind.  5.  Allergic rhinitis.  6.  Steroid-induced hyperglycemia.  7.  Questionable hypothyroidism in September 2006.   PAST SURGICAL HISTORY:  Significant for second finger injury where pins were  placed.   MEDICATIONS:  1.  Advair 250/50 1 puff b.i.d.  2.  Diovan 160 milligrams p.o. daily.  3.  Atrovent 2 puffs q.6h.  4.  Albuterol 2 puffs q.6h.  5.  Serevent 2 puffs every 12 hours.   ALLERGIES:  No known drug allergies.   SOCIAL HISTORY:  The patient quit smoking 10 years ago but does admit to  alcohol use, 40 ounces of beer per week.   FAMILY HISTORY:  Significant for father who died of a MI in 28.   REVIEW OF SYSTEMS:  Negative except for rhinorrhea. He denies any  changes in  stool. No diarrhea or vomiting.   PHYSICAL EXAMINATION:  VITAL SIGNS:  Temperature 98.6, heart rate 107, BP  137/95, respiratory rate 22, pulse oximeter 98% on room air.  GENERAL:  The patient is in no acute distress and is speaking in complete  sentences.  HEENT:  Oropharynx is clear. No adenopathy. Thyroid not palpated.  CARDIOVASCULAR:  Regular rate and rhythm.  PULMONARY:  She is speaking in complete sentences. No retractions but does  have in-expiratory wheezing throughout.  ABDOMEN:  Soft and nontender.  EXTREMITIES:  No edema and no cyanosis.   Chest x-ray was significant for emphysema that was worse on the left with  pulmonary vascular shunting to the right, interstitial chronic lung disease  consistent with a bolus emphysema and superimposed airspace disease.   ASSESSMENT/PLAN:  1.  This is a 50 year old with a history of asthma, chronic obstructive      pulmonary disease, and hypertension with chronic  obstructive pulmonary      disease exacerbation. We will start the patient on prednisone 40      milligrams p.o. daily for a total of five days. He was given 60      milligrams of prednisone in the emergency room. We will start the      patient on doxycycline 100 milligrams p.o. b.i.d. to complete a five or      seven-day course. We will place the patient on albuterol 5 milligrams      nebulizers as well as 0.5 milligrams every four hours and then q.2h.      p.r.n. We will place the patient on his home dose of Advair 500/50 and      change to MDI in the a.m.  2.  For hypertension, we will continue his home dose of Diovan 160      milligrams daily.  3.  Fluid, electrolyte and nutrition:  Saline lock. No IV placed.      Electrolytes are stable. He will have a regular diet. We will consult      social work for medication assistance and watch the patient for symptoms      of alcohol withdrawal.  4.  Disposition:  Continue to follow and anticipate discharge within  the      next one or two days.      [name], M.D.    ______________________________  [name], M.D.    MR/MEDQ  D:  08/03/2005  T:  08/03/2005  Job:  [id number]

## 2010-12-02 NOTE — Discharge Summary (Signed)
[name], [name] NO.:  [id number]   MEDICAL RECORD NO.:  [id number]          PATIENT TYPE:  INP   LOCATION:  4737                         FACILITY:  [facility]   PHYSICIAN:  Maurice March, M.D.DATE OF BIRTH:  Dec 16, 1960   DATE OF ADMISSION:  06/11/2006  DATE OF DISCHARGE:  06/16/2006                               DISCHARGE SUMMARY   CHIEF COMPLAINT:  Dyspnea.   PRINCIPAL DIAGNOSES:  Chronic obstructive pulmonary disease  exacerbation.   SECONDARY DIAGNOSES:  1. Underlying known asthma.  2. Probable chronic bronchitis due to tobacco abuse.  3. A previous history of polysubstance abuse and rheumatoid arthritis      as well as hypertension.   MEDICATIONS ON DISCHARGE:  The patient will continue his Advair Diskus  as before 500/5 b.i.d., Diovan 160 mg daily, albuterol 2 puffs q.i.d. as  before, Spiriva HandiHaler will be substituted for Atrovent, Singulair  10 mg daily as before, sulfasalazine 500 mg b.i.d. as before for  rheumatoid arthritis.  He uses [facility] on [address], and  he will be starting an additional antihypertensive medication  hydrochlorothiazide 25 mg daily and Avelox 400 mg daily to complete a  total of 10 days of treatment and a prednisone taper starting at 60 mg  daily x3 days to 40 mg daily x3 days, 30 x3 days, 20 x3 days, 10 x3  days, then discontinue.   DISPOSITION:  [name] was quite adamant about leaving today as he  does have a daughter that he has been requesting insistently to be able  to pick up since yesterday.  In any event, he had made arrangements for  Friday.  However, he is unable to do so today.  I have instructed him to  return to the hospital if he has worsening of his symptoms.  He does see  Dr. Dow Adolph at [facility] and will be following up with her  on June 24, 2006.  He is instructed to return to the Emergency  Department if he develops worsening of symptoms.   HISTORY OF  PRESENTING ILLNESS:  For full details, please refer to the  H&P as dictated by Dr. Hillery Aldo.  However briefly, [name] is a  50 year old male with a history of multiple hospitalizations for acute  asthma exacerbation secondary to medical noncompliance who presented  with a 1-week history of increased wheezing, dyspnea, cough productive  of yellow sputum.  He denied any associated fevers, chills, or chest  pain.  He ran out of his Advair and albuterol approximately 3 days prior  to presentation.  He reported that he is more fatigued and lethargic  than usual.   HOSPITAL COURSE:  The patient was admitted and initiated on frequent  nebulizer treatments as well as IV Solu-Medrol transitioned to oral.  He  continued to have a considerable amount of wheezing although this did  improve.  More significantly though, [name] is very insistent that  he was to leave on Friday.  We were able to talk him into staying over  until Saturday.  His breathing was  improved.  We placed him back on IV  Solu-Medrol and a higher dose of prednisone taper per the outpatient  setting.  He does appear to be stable at this time.  I have provided our  contact information if he does require rehospitalization.  I have  instructed him to present to the Emergency Department and request that  we be contacted.   LABORATORY DATA:  His laboratory data revealed his sodium to be 139,  potassium 4.1, BUN 16, creatinine 0.7, glucose 156, hemoglobin 11.8,  WBC 10.2, and platelet count 438.  His CBGs have been mildly elevated  throughout his hospital course felt to be secondary to steroids.  He has  declined insulin therapy while in the inpatient setting as well as  outpatient setting.      Hettie Holstein, D.O.  Electronically Signed     ______________________________  Maurice March, M.D.    ESS/MEDQ  D:  06/16/2006  T:  06/16/2006  Job:  [id number]   cc:   Maurice March, M.D.

## 2010-12-02 NOTE — Discharge Summary (Signed)
[place]  Patient:    [name], [name] Visit Number: [id number] MRN: [id number]          Service Type: MED Location: [id number] Attending Physician:  Anise Salvo Dictated by:   Jennette Kettle, M.D. Admit Date:  07/19/2001 Disc. Date: 07/20/01   CC:         Dr. Audria Nine, [facility]   Discharge Summary  DISCHARGE DIAGNOSES: 1. Acute asthma exacerbation. 2. Chronic obstructive pulmonary disease. 3. Upper respiratory signs and symptoms of infection, likely viral. 4. History of tobacco abuse. 5. History of alcohol excess. 6. History of arthritis involving the wrists and ankles. 7. History of hypertension. 8. Disability secondary to above comorbidities.  DISCHARGE MEDICATIONS: 1. Sterapred 10 mg steroid package to be given to the patient at the time    of discharge and to be taken for six days. 2. Resume the following medications, which include:    a. albuterol inhaler 1-2 puff every four hours p.r.n. asthma,    b. Advair Diskus 1 puff b.i.d.,    c. Diovan 160 mg p.o. q.d.,    d. Clarinex 1 tablet p.o. q.d.,    e. Singulair 10 mg p.o. q.d.,    f. Atrovent 2 puffs q6-8h, and;    g. sulfasalazine 500 mg q.d.  FOLLOW-UP:  Patient will resume follow up at [facility] with a previously scheduled appointment on August 02, 2001.  PROCEDURES AND DIAGNOSTIC STUDIES:  Patient had a chest x-ray on July 19, 2001 revealing stable chronic changes with possible bronchitis, but no pneumonia evident.  ADMISSION HISTORY AND PHYSICAL:  Briefly, [name] is a 50 year old African-American male with a history of asthma times five years with underlying COPD who has had two recent hospitalizations for recurrent asthma exacerbation in September and November of 2002.  The patient presented to the emergency room on July 19, 2001 with a two-day of progressively worsening shortness of breath and a dry cough along with rhinorrhea.  Patient  states that he ran out of his Atrovent and Clarinex medication three to four days prior to admission.  The patient states he ran out because he did not have prescriptions to refill them.  The patient denies any recent fevers, chills, night sweats, dysuria, hematuria, nausea, vomiting, diarrhea, constipation, bright red blood per rectum, melena, or pharyngitis; however, does state that he has some chest tightness associated with his shortness of breath along with positive wheezing.  Patient states that his peak flows have decreased prior to admission to levels of 200-210.  Patients normal range is 275-300 and on a good day may reach 350.  ADMISSION LABORATORIES:  Patient had a CBC revealing white count 5.0, hemoglobin 13.4 and platelets of 292,000 with eosinophils on differential of 22% with an absolute eosinophil count of 1.1.  Patients electrolytes on and revealed sodium 139, potassium 4.1, chloride 106, bicarb 27, BUN 5, creatinine 0.8, and glucose 106.  HOSPITAL COURSE:  #1 Acute asthma exacerbation:  Patient has had two previous hospitalizations for his recurrent asthma exacerbations.  Patients initial pulse ox was 95-96% on room air.  Patient did have positive chest tightness as well as wheezing upon auscultation.  The patient was admitted and was given Atrovent and albuterol nebs.  Patient was started on Decadron steroids IV. Patient was continued on his home Advair, Singulair and Clarinex.  Likely etiology for the patients acute asthma exacerbation stems from a recent upper respiratory infection and possible bronchitis.  Patient was afebrile throughout his  hospital course and did not have an elevated white count, and was not started on antibiotics.  The patients chest x-ray revealed COPD with evidence of hyperinflation and large bulla.  There was also some interstitial parenchymal lung scarring that appeared chronic.  The ongoing COPD likely also caused some of his exacerbation in  the setting of his upper respiratory infection.  Patients O2 sats remained in the mid 90s.  Patients peak flows increased to a level of 250-260 on the day of discharge.  Patient was breathing much more comfortably with decreased wheezing.  Further evaluation as far as allergy testing may be indicated on an outpatient basis if recurrent asthma exacerbations occur.  Patient states he does not have pets at home, but does have a relative that smokes at home.  Further evaluation of the patients living condition should be made on an outpatient basis.  #2 Eosinophilia:  Patient had a mild increased eosinophil count on his CBC, which was consistent with his recurrent asthma/allergic rhinitis/possible bronchitis.  Patients CBC was rechecked on the day of discharge and his eosinophil count had decreased to a level of 0.7.  #3 Routine health maintenance:  Patient was given a Pneumovax vaccine on the day of discharge.  Patient stated that he had had an influenza shot already.  #4 Tobacco abuse:  Patient states that he quit tobacco approximately three to four months ago.  Continue with cessation counselling as an outpatient.  #5 Medicaid:  Patient had a Medicaid override form signed so that the patient could fill more than six prescriptions.  Patient stated that he previously has not been able to fill his Singulair or his atrovent, which may have led to some of his worsening acute asthma exacerbation.  DISCHARGE LABORATORIES:  Patient had a CBC on the day of discharge revealing a white count of 4.3, hemoglobin 13.3 with platelets of 300,000 with a differential revealing no left or right shift, and a decreased absolute eosinophil count.   Dictated by:   Jennette Kettle, M.D. Attending Physician:  Anise Salvo DD:  07/20/01 TD:  07/20/01 Job: [id number]

## 2010-12-02 NOTE — Discharge Summary (Signed)
NAME:  [name], [name]                           ACCOUNT NO.:  [id number]   MEDICAL RECORD NO.:  [id number]                   PATIENT TYPE:  INP   LOCATION:  4708                                 FACILITY:  [facility]   PHYSICIAN:  [place] Tetlow                      DATE OF BIRTH:  [birth year]   DATE OF ADMISSION:  03/09/2002  DATE OF DISCHARGE:  03/11/2002                                 DISCHARGE SUMMARY   RESIDENT:  James Ivanoff, M.D.   ATTENDING PHYSICIAN:  Madaline Guthrie, M.D.   DISCHARGE DIAGNOSIS:  Chronic obstructive pulmonary disease with  exacerbation.   ADMISSION DIAGNOSIS:  Chronic obstructive pulmonary disease with  exacerbation.   PAST MEDICAL HISTORY:  1. Hypertension.  2. Rheumatoid arthritis.  3. Cocaine and marijuana abuse.  4. History of tobacco abuse.   DISCHARGE MEDICATIONS:  1. The patient was instructed to take 60 mg of oral prednisone on the     evening of his hospital discharge, to be followed by 10 days of p.o.     prednisone 60 mg q.d.  2. He was also instructed to continue with his home medications, which were     continued throughout his hospitalization, including albuterol inhaler two     puffs q.i.d.  3. Advair Diskus 250/50, one or two puffs b.i.d.  4. Singulair 10 mg q.h.s.  5. Sulfasalazine 500 mg p.o. q.d.  6. Diovan 80 mg p.o. q.d. or as directed by his physician.  7. Clarinex as directed by his physician.   FOLLOW UP:  On Thursday, April 10, 2002, at 2 p.m. with Dr. Joesph July.  During his follow-up visit, the patient's chronic obstructive pulmonary  disease status will be assessed, including the patient's ability to awake  early and take his inhalers and medicines, as well as stay from illicit drug  use, including inhaled cocaine and marijuana.  The patient received a  Pneumovax vaccine on the day of discharge.  The patient is also interested  in establishing his long-term medical care at the [facility], and  would like to  have this addressed during his appointment with Dr. Daphine Deutscher.  He  will also have some pending labs, including serum antitrypsin and rheumatoid  factor.   DISPOSITION/INSTRUCTIONS:  The patient was discharged to home in stable  condition with special instructions, including the recommendation to return  to the emergency room if he became severely short of breath, palpitations,  or felt presyncopal, or developed chest pain.   PROCEDURE:  1. Chest x-ray on March 09, 2002:  With comparison from November 10, 2001:     Significant for severe chronic lung disease including chronic scarring in     the left upper lobe and blackout of the left lower lobe.  A new right     effusion was noted.  No cardiomegaly was noted.  Patchy haziness of  the right lung was slightly  suggestive of pulmonary edema, but was interpreted as chronic scarring.  1. A chest x-ray on March 10, 2002 (later same day):  No interval change,     again chronic bilateral obstructive changes noted, including bleb     formation in the left upper lung field.  2. A 2-dimensional echocardiogram on March 10, 2002:  This study showed a     normal left ventricular ejection fraction of 55%-65%, with no wall motion     abnormalities, but asymmetric ventricular septal hypertrophy (mild), was     noted.   CONSULTATIONS:  None.   HISTORY OF PRESENT ILLNESS:  The patient is a 50 year old African-American  male with a history of severe emphysema with lung scarring, who at baseline  becomes short of breath walking three to four feet, as well as a history of  hypertension.  He presents with acute shortness of breath on the morning of  admission, after being exposed to cigarette and marijuana smoke on the day  previously.  He states that he is admitted to the hospital every several  months for a chronic obstructive pulmonary disease exacerbation.  He states  that he is compliant with him home medicines, including albuterol MDI and  nebs q.i.d.,  as well as Advair Diskus, which had recently been increased to  250/50, as well as daily Singulair.  He admits to a 12-pack-year-history of  tobacco and states that he quit seven months ago.  He denies any recent  crack, tobacco, or marijuana use.  He denies any occupational __________  to  his lungs, and states that his history of marijuana abuse extended from 10-  13 years, with approximately 20 joints a week.   ALLERGIES:  No known drug allergies.   PAST MEDICAL HISTORY:  1. Chronic obstructive pulmonary disease.  2. Rheumatoid arthritis.  The patient is unsure how he was diagnosed.  3. Hypertension.   MEDICATIONS:  1. Albuterol MDI or nebs q.i.d.  2. Advair 250/50 two puffs b.i.d.  3. Singulair 10 mg p.o. q.h.s.  4. Sulfasalazine 500 mg p.o. q.d.  5. Diovan, unspecified dose.  6. Clarinex, unspecified dose.   SOCIAL HISTORY:  Significant for smoking, as mentioned in the HPI, and in  addition 9-12 beers per week, and crack x15 years, but none in the last five  years.  He denies IV drug use.  He states that he attends ABS classes every  Thursday as part of his drug rehabilitation.  He is single, disabled.  He  has Medicaid and Medicare, and in the past has received his medical care  through Dr. Maurice March at [facility]  He lives with his mother,  sister, and brother, and states that there are no problems at home.  He has  a girlfriend.   FAMILY HISTORY:  Significant for a healthy mother age 42.  Father who died,  secondary to a myocardial infarction at age 31.  He has a sister who has  adult onset non-insulin-dependent diabetes mellitus type 2, as well as three  healthy children ages 21, 86, and 32.  He has a brother with cerebral palsy  who is in a wheelchair.   REVIEW OF SYSTEMS:  Significant for a 4-pound weight gain, baseline  wheezing, lifelong bruising, as well as joint pain at his wrists and knees.  The patient states he receives a flu shot every year.    PHYSICAL EXAMINATION:  VITAL SIGNS:  Temperature 98.2 degrees, pulse 96,  blood pressure 164/96, respirations 22-24.  Oxygen saturation 95% on room  air.  GENERAL:  An African-American male, with increased work of breathing,  including costal retractions, stomach breathing, heavy chest heaving, who  talks easily without becoming short of breath.  HEENT:  Eyes:  Bilateral scleral icterus.  Pupils equal, reactive to light  and accommodation.  Extraocular muscles intact.  ENT:  No sinus tenderness.  Hyperemic nasal mucosa and poor dentition were noted.  NECK:  Mild jugular venous distention.  No thyromegaly or thyroid nodules.  LUNGS:  Diffuse loud expiratory, greater than inspiratory wheezes throughout  the anterior and posterior for one or two hours post-albuterol nebulizer.  CARDIOVASCULAR:  A regular rate and rhythm.  No murmur.  ABDOMEN:  Positive hepatojugular reflux, soft, with positive bowel sounds,  nontender, nondistended, with no hepatosplenomegaly noted.  EXTREMITIES:  Left knee with effusion, warm, nontender.  Dorsalis pedis and  posterior tibial pulses 2+ and equal.  GENITOURINARY:  Deferred.  SKIN:  Questionable jaundice of the head.  NODES:  No lymphadenopathy.  MUSCULOSKELETAL:  5/5 upper and lower extremity strength.  Normal and  intact.  NEUROLOGIC:  Grossly intact, including cranial nerves II-XII intact.  Deep  tendon reflexes bilaterally equal and 2+.  Normal peripheral sensation in  the fingers and toes.  Gait was not assessed.  PSYCHIATRIC:  This is appropriate.  Makes good eye contact and is alert and  oriented x3.   LABORATORY DATA:  White blood cell count 11.5, hemoglobin 14.1, platelets  280, hematocrit 43.1.  ANC elevated at 11.  The differential count includes  95% neutrophils, MCV 92.3.  Sodium 138, potassium 3.5, chloride 105,  bicarbonate 25, BUN 4, creatinine 0.7, glucose 138.  Anion gap 8, calcium  9.1, protein 6.8, albumin 3.6, bilirubin 0.8, alkaline  phosphatase 75, AST  20, ALT 16.  Urine drug screen positive for cocaine.  Troponin I 0.04, CK  166, CK-MB 3.5.  ABG on room air:  pH 7.296, 40.3, 69.0, 25, with 94% oxygen  saturation.  Urinalysis:  1.006, pH 6.5, otherwise negative.   Chest x-ray:  Significant for severe chronic obstructive pulmonary disease  with scarring on the left and possibly also the right, with a right small  effusion.   Electrocardiogram:  With 95 beats per minute with regular P-waves, normal  axis, slightly prolonged QT interval and changes suggesting a left  ventricular hypertrophy which should not meet criteria.   ASSESSMENT/PLAN:  1. Chronic obstructive pulmonary disease exacerbation:  The patient was     treated with intravenous steroids, frequent nebulizers, and Tequin.  A     chest x-ray was consistent with chronic obstructive pulmonary disease     changes, but it is unclear if his 12-pack-year-history of tobacco and    history of marijuana abuse and crack cocaine abuse are the sole causes of     these lung changes.  Should check alpha-I, antitrypsin levels in the     future.  Although the patient does not appear to be in congestive heart     failure, given his lack of cardiomegaly on chest x-ray, lack of     peripheral edema, lack of a cough, and no history of orthopnea or     paroxysmal nocturnal dyspnea, we will check a 2-dimensional     echocardiogram, given the questionable electrocardiogram changes, as well     as his cocaine-positive urine drug screen, to rule out cardiomyopathy.  2. Hypertension:  The patient was placed on a  decreased sodium diet and     Diovan 80 mg p.o. q.d.  He was not started on a beta blocker because of     fear of vasospasm, given his recent cocaine use, because of concern of an     asthma component to his pulmonary disease.  3. Hypokalemia, likely secondary to albuterol use, repleted.  4. Cocaine abuse:  Monitored for withdrawal.  Rule out a myocardial     infarction  with enzymes x3.  Aspirin 325 mg p.o. q.d.  5. Rheumatoid arthritis:  Continue sulfasalizine.  Get records, to find out     how the diagnosis was made, or order a rheumatoid factor.  6. Possible left ventricular hypertrophy:  Obtain a 2-dimensional     echocardiogram.  7. Slightly increased troponin I:  Will continue to check cardiac enzymes.  8. Increased white blood cell count, secondary to lung infection, versus     acute demargination.  Start Tequin.  Watch for a fever and resolution of     white blood cell count.  9. Scleral icterus with possible jaundice of the head:  The patient admits     to 9-12 beers a week.  Liver function tests appeared to be within normal     limits.  No definite AST-ALT split is noted.  Counseling provided.  10.      Allergies:  Hyperemic nasal mucosa was noted on examination.     Continue with outpatient __________  or equivalent.   HOSPITAL COURSE:  1. CHRONIC OBSTRUCTIVE PULMONARY DISEASE:  The patient progressively     improved during his two-day stay in the hospital.  He received IV     steroids that were gradually tapered and eventually changed to p.o.     prednison on the day of discharge.  Although the patient was placed on     nasal cannula oxygen, he frequently removed this and breathed in the mid-     90s without oxygen throughout the hospitalization.  His pulmonary exam     slightly improved from admission, but was still significant for diffuse     bilateral wheezing on both inspiration and expiration.  The patient was     encouraged to get out of bed and ambulate, but preferred to sleep     throughout the day.  When the patient did get up from bed, he stated that     his dyspnea had improved since admission, in that he could walk farther     without becoming short of breath.  He was eventually discharged on p.o.     prednisone (no taper), including 60 mg of prednisone on the evening of    hospital discharged, followed by 10 days of p.o.  prednisone at 60 mg q.d.     He was encouraged to stay away from smoking substances of any kind.   1. HYPERTENSION:  The patient was placed on a Diovan equivalent during his     hospital stay (Avapro 150 mg p.o. q.d.), and his blood pressure was     moderately well-controlled, ranging from 135-150 systolic over 75-90     diastolic.  He is encouraged to take his Diovan home medicine and to     follow up regularly with his doctor.   1. HYPOKALEMIA:  The patient was repleted and this level corrected to 4.0 on     the day of discharge.   1. COCAINE ABUSE:  The patient did not show signs of cocaine withdrawal,  including denial of anxiety or diaphoresis, or labile vital signs.   1. RHEUMATOID ARTHRITIS:  A rheumatoid factor was ordered.   1. LEFT VENTRICULAR HYPERTROPHY?:  Although the echocardiogram did not show     left ventricular hypertrophy, some asymmetric septal changes were noted.     This did not change the patient's treatment.   1. INCREASED TROPONIN I IN FIRST SET OF CARDIAC ENZYMES:  The patient's     troponin I was only slightly elevated and repeat levels were lower.     Order was written for cardiac enzymes, to be cycled q.8h.  Only two sets     of enzymes were available.  Another set was concerning or suggestive of a     myocardial infarction.   1. INCREASED WHITE BLOOD CELL COUNT:  The patient's white blood cell count     increased from 11.5 on admission to 15.4 on the day of discharge, but the     patient showed no signs of infection, including no complaints of dysuria,     or abdominal pain, sinus pain.  He was afebrile throughout his admission.     The Tequin dose he was started on at the beginning of admission was     discontinued from the day of discharge, and the patient's paradoxical     increase in white blood cells while on steroids was noted.   DISCHARGE LABORATORY DATA:  White blood cell count 15.4, hemoglobin 13.8,  hematocrit 41.3, platelets 300.  Sodium  136, potassium 4.0, chloride 105,  bicarbonate 24, BUN 13, creatinine 0.8, glucose 104.   DISCHARGE VITAL SIGNS:  The patient was afebrile with a blood pressure  ranging from 135-150/75-90, pulse 85, respirations 20, oxygen saturation 97%-  100% on room air.   PENDING LABORATORY DATA:  Rheumatoid factor and a pending serum alpha  antitrypsin level.                                                 [place] Tetlow    GT/MEDQ  D:  03/11/2002  T:  03/13/2002  Job:  [id number]   cc:   Joesph July, M.D.  [facility]

## 2010-12-02 NOTE — H&P (Signed)
[name], [name]                 ACCOUNT NO.:  [id number]   MEDICAL RECORD NO.:  [id number]          PATIENT TYPE:  INP   LOCATION:  5501                         FACILITY:  [facility]   PHYSICIAN:  Altamese Cabal, M.D.  DATE OF BIRTH:  September 27, 1960   DATE OF ADMISSION:  02/07/2005  DATE OF DISCHARGE:                                HISTORY & PHYSICAL   CHIEF COMPLAINT:  Shortness of breath.   HISTORY OF PRESENT ILLNESS:  This is a 50 year old male with history of  asthma and hypertension who presents with increased shortness of breath x1  day.  He states he ran out of his Advair on Sunday afternoon and since has  experienced increasing shortness of breath and dyspnea on exertion.  The  patient has been hospitalized about 2-3 times a year with asthma  exacerbations.  He has never been intubated.  His peak flow was 175 today.  His best is 375.  The patient identifies his triggers as medication  noncompliance.  In the ED, the patient had a 5 mg Albuterol neb, prednisone  60 mg p.o., one continuous one hour Albuterol/Atrovent nebulizer.  The  patient then walked around the emergency room.  However, he became very  short of breath during this test, though his oxygen saturations remained  about 95% on room air.  The patient feels about as short of breath right now  as he has on previous admissions.  He denies fever, chills.  Does note a  productive cough that began yesterday.   REVIEW OF SYSTEMS:  Negative for chest pain, abdominal pain, dysuria,  hematuria, hematochezia, hemoptysis, melena, visual changes, headache, sore  throat, myalgias, numbness and tingling in the extremities.   PAST MEDICAL HISTORY:  1.  Asthma diagnosed five years ago.  2.  Hypertension.  3.  History of polysubstance abuse.  4.  Arthritis.   MEDICATIONS:  1.  Advair 500/50 two puffs b.i.d.  2.  Albuterol.  3.  Atrovent.  4.  Diovan 160 mg daily.   ALLERGIES:  No known drug allergies.   FAMILY HISTORY:  The  patient's father died of a heart attack at age 103.  Mother is alive and healthy.  He does not have any siblings.   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  He lives  with his wife.  He has three grown children ages 83, 58 and 77.  The patient  states he quit smoking three months ago.  He has a five year pack history  prior to that.  The patient says he drinks about a six pack of alcohol a  week.  Answers no to all the cage questions.  His last drink was a 40 ounce  beer on Friday.   PHYSICAL EXAMINATION:  VITAL SIGNS:  Temperature 98.5, blood pressure  169/110, pulse 99-112, respiratory rate 25.  He is 100% on 4 liters of nasal  cannula.  GENERAL APPEARANCE:  He is alert and oriented, thin male who appears his  stated age in no acute distress.  HEENT:  Head is normocephalic, atraumatic.  Pupils equal, round and reactive  to light and accommodation.  Extraocular movements intact.  Conjunctivae  clear.  Oropharynx clear.  Mucous membranes moist.  CARDIOVASCULAR:  Regular rate and rhythm.  No murmurs, rubs or gallops.  Pulses are 2+ and equal in extremities.  LUNGS:  Positive supraclavicular retraction, very tight on exam with diffuse  expiratory and inspiratory wheezes.  ABDOMEN:  Normoactive bowel sounds.  Soft, nontender, nondistended.  No  hepatosplenomegaly.  EXTREMITIES:  No cyanosis, clubbing or edema.  SKIN:  No rashes or bruises.  NEUROLOGICAL:  Cranial nerves II-XII grossly intact.  Strength 5/5  throughout.  Normal sensation throughout.  No gait.  Reflexes 2+ and equal  throughout.   LABORATORY DATA:  Sodium 140, potassium 4.0, chloride 107, bicarbonate 24,  BUN 5, glucose 107, hemoglobin 15.0, hematocrit 44%.  Venous DH 7.376, PCO2  41.7, bicarbonate 24.4.  Chest x-ray shows hyperinflation with flattened  diaphragms.  No acute infiltrate.   ASSESSMENT/PLAN:  This is a 50 year old with an asthma/COPD exacerbation.   PROBLEMS:  #1 - ASTHMA/COPD  EXACERBATION.  The patient has had good O2  saturations and his ABG is not __________.  However, he is extremely  dyspneic on exertion and short of breath at rest.  His chest exam  demonstrates poor air flow and wheezes throughout.  We will do Albuterol  nebulizers q.2.h. scheduled in q.1h. p.r.n.  These can be spaced out as the  patient improves.  Will do Atrovent nebulizer treatments every 6 hours.  Will continue him on prednisone 60 mg p.o. daily.  We will start doxycycline  secondary to this increased sputum production and cough.  We will also  follow peak flows on the patient.   #2 - HYPERTENSION.  Continue Diovan.  Will add HCTZ if blood pressure is not  controlled.   #3 - FLUIDS, ELECTROLYTES AND NUTRITION.  We will do an AN, CMP and CBC with  differential.  Regular diet for him.  No IV fluids needed at this time.       KS/MEDQ  D:  02/07/2005  T:  02/07/2005  Job:  [id number]

## 2010-12-02 NOTE — Discharge Summary (Signed)
NAME:  [name], [name] NO.:  [id number]   MEDICAL RECORD NO.:  [id number]          PATIENT TYPE:  INP   LOCATION:  5013                         FACILITY:  [facility]   PHYSICIAN:  Benn Moulder, M.D.      DATE OF BIRTH:  11/07/1960   DATE OF ADMISSION:  10/17/2005  DATE OF DISCHARGE:  10/19/2005                                 DISCHARGE SUMMARY   Patient's primary care physician is [facility].   DISCHARGE DIAGNOSES:  1.  Chronic obstructive pulmonary disease exacerbation.  2.  Hypertension.  3.  Polysubstance abuse.  4.  Asthma.  5.  Anemia.   PROCEDURES:  1.  Chest x-ray on October 17, 2005, showed severe stable COPD with scarring      and retraction of left hilus, large blebs and bullae again noted on left      and some air trapping also on the right.  2.  A chest x-ray on October 18, 2005, showed severe bullous emphysema with bi-      apical scarring, left greater than right, and no acute findings.  3.  EKG showed sinus tachycardia and LVH but no ST segment changes.  4.  Venous Dopplers were negative for DVT.   ADMISSION LABS:  Hemoglobin 14.2, hematocrit 41.7, platelet count 626,  sodium 134, potassium 5.5, chloride 100, bicarb 27, BUN 11, creatinine 0.9,  glucose 133.  Liver function tests were within normal limits.  ABG on  admission:  A pH 724, pCO2 69.3, pO2 185, bicarb 30.  A second ABG later  that evening:  A pH 7.39, pCO2 42.6, pO2 of 75, bicarb 25.6.   Urine drug screen positive for cocaine.   Cardiac enzymes were negative x3.   Stool was hemoccult negative.   Hemoglobin at discharge was 11.3.   BRIEF HISTORY OF PRESENT ILLNESS:  [name] is a 50 year old African-  American male with history of COPD, asthma, hypertension as well as  extensive smoking history who presented to the ER for increased shortness of  breath and wheezing that started on the morning of admission and got  progressively worse despite using his albuterol frequently.  The  patient  also noted that he had been off of his Advair inhaler for several days.  Initial ABG showed a pH of 7.25, bicarb of 69.3, pH of 7.24 and pCO2 of  69.3.  patient was placed on BiPAP in the ER.  Chest x-ray showed stable  COPD with left upper lobe scarring.  In the ER patient received Solu-Medrol  125 mg x1 as well as mag sulfate 2 mg IV x1 as well as an albuterol  nebulizer.  Patient also received terbutaline by EMS.  Patient was admitted  to the family practice teaching service under the diagnosis of COPD  exacerbation/asthma exacerbation.   HOSPITAL COURSE:  1.  Shortness of breath, patient with COPD exacerbation.  Patient was      started on Solu-Medrol 60 mg IV q.6 hours.  He was changed over to      prednisone 60 mg p.o. daily and will complete  a 10-day course of this.      Patient received continuous albuterol, Atrovent neb treatments.  These      were gradually spaced out and at the time of discharge patient was only      using an albuterol MDI 2.5 mg four times daily.  Patient was placed in      BiPAP for a short time, but this was discontinued once the second ABG      came back as above.  Patient was without an oxygen requirement for 24      hours at the time of discharge.  Patient also was continued on his      Advair 500/50 one puff inhaled b.i.d. and was started on Spiriva 18 mcg      inhaled once daily.  Patient also noted increased sputum production and      cough on the second day of admission.  Patient was started on      doxycycline 100 mg p.o. b.i.d. for which she will complete a 7-day      course on October 24, 2005.  Patient was maintaining oxygen saturations on      room air and with walking, was tolerating a regular diet and was feeling      close to baseline at the time of discharge.  2.  Hypertension.  Patient was continued on his home medication of Diovan      160 mg p.o. daily.  The patient's blood pressures were impeccable      throughout his hospital  course.  3.  Polysubstance abuse.  Patient with excessive history of drug abuse.      Patient's urine drug screen was positive for cocaine.  Counseled the      patient on the importance of drug cessation for his overall health, and      particularly with his chronic medical conditions.  4.  Anemia.  Patient's hemoglobin was 14.2 on admission.  This gradually      decreased and was 11.3 at the time of discharge.  Patient's stools were      checked and was hemoccult negative.  I would recommend checking a      hemoglobin as an outpatient and doing anemia workup if necessary.  5.  Fluid and electrolytes.  Patient tolerating regular diet throughout most      of his hospital course.  The patient's potassium was initially high at      5.5.  It was actually low at 3.1 on the day of discharge.  Hypokalemia      was presumed secondary to albuterol inhaler use.  Patient had his      potassium replaced orally prior to discharge.   DISCHARGE MEDICATIONS:  1.  Spiriva 18 mcg inhaled once daily.  2.  Advair 500/50 one puff inhaled b.i.d.  3.  Doxycycline 100 mg p.o. b.i.d. through October 24, 2005.  4.  Albuterol MDI two puffs q.4 hours p.r.n. shortness of breath.  5.  Diovan 160 mg p.o. daily.  6.  Prednisone 60 mg p.o. daily through October 24, 2005.   FOLLOWUP APPOINTMENTS:  Is with [facility].  Patient was instructed to  call for appointment early next week.      Benn Moulder, M.D.     MR/MEDQ  D:  10/19/2005  T:  10/20/2005  Job:  [id number]   cc:   [facility]

## 2010-12-02 NOTE — H&P (Signed)
NAME:  GRIFFEY, NICASIO NO.:  [id number]   MEDICAL RECORD NO.:  [id number]          PATIENT TYPE:  EMS   LOCATION:  MAJO                         FACILITY:  [facility]   PHYSICIAN:  Jackie Plum, M.D.DATE OF BIRTH:  [birth year]   DATE OF ADMISSION:  11/09/2004  DATE OF DISCHARGE:                                HISTORY & PHYSICAL   CHIEF COMPLAINT:  Shortness of breath.   HISTORY OF PRESENT ILLNESS:  Patient is a 50 year old African American  gentleman with history of bronchial asthma, never intubated, not steroid  dependent, no constipation who presented with above complaint without any  chest pain.  He denies any history of fever or chills.  He denies any PND or  orthopnea or lower extremity swelling.  Patient indicates that he has been  coughing without any sputum production.  No dysuria or frequency of  micturition or nocturia.  At the emergency room, he was noted to be  wheezing.  He received bronchodilator nebulizations with IV steroids without  any improvement.  Magnesium sulfate was subsequently given IV and  Hospitalists service was asked to evaluate for admission.   ALLERGIES:  No known drug allergies.   CURRENT MEDICATIONS:  Advair.  Albuterol nebulization.  Singulair.  Darvon.  Patient could not tell the doses of these medications.   PAST MEDICAL HISTORY:  1.  History of bronchial asthma as stated above.  2.  He also has history of hypertension.  3.  According to E-chart, there is previous history of polysubstance      abuse.   FAMILY HISTORY:  Positive for heart disease.   SOCIAL HISTORY:  Patient is unemployed.  He quit smoking cigarettes,  according to the patient, four months ago.  He use to smoke about one to two  packs of cigarettes daily for more than 10 years.  He denies the use of any  illicit drugs currently.  He drinks alcohol occasionally.   REVIEW OF SYMPTOMS:  Significant positives and negatives as noted in the HPI  that was  unremarkable.   PHYSICAL EXAMINATION:  GENERAL APPEARANCE:  Patient was in mild respiratory  distress.  VITAL SIGNS:  Temperature 99.2 degree F, heart rate 104, blood pressure  124/80, respirations 22, oxygen 96% on two liters per minute by nasal  cannula.  HEENT:  Normocephalic and atraumatic.  Pupils are equal, round and reactive  to light.  Extraocular movements intact. Oropharynx moist.  No erythema.  NECK:  Supple.  No JVD.  LUNGS:  Diffuse expiratory wheezes without any crackles or rhonchi.  ABDOMEN:  Soft and nontender.  Bowel sounds were present, normoactive.  EXTREMITIES:  No cyanosis, or edema.  CNS:  Patient is alert and oriented x3, no focal deficits.   X-ray showed no active infiltrates.   A 12-lead EKG indicated some tachycardia at 123 beats per minute with no  specific STT wave changes.   A pH of 7.398, pCO2 51.9, pO2 101.0, bicarbonate 32, good saturation of 98%.  A wbc count is 10.6, hemoglobin 12.4, hematocrit 37.1, MCV 86.4, platelet  count 480.  Calcium 8.4,  sodium 134, potassium 3.9, chloride 98, CO2 28,  glucose 79, BUN 5, creatinine 0.7.  CK-MB 5.8, troponin I less than 0.07,  myoglobin more than 500.  BNP 98.8.   IMPRESSION:  Acute presentation of shortness of breath in a patient with  history of bronchial asthma and severe asthma.  Patient is hypercarbic on  ABG and he may have a complaint of chronic obstructive pulmonary disease and  asthma.  He is admitted for asthma exacerbation treatment with Zithromax,  steroids and nebulizers as well as other supportive measures.      GO/MEDQ  D:  11/09/2004  T:  11/09/2004  Job:  [id number]   cc:   Maurice March, M.D.  [address] [place]  [place] [address]  Fax: [phone]

## 2010-12-02 NOTE — Discharge Summary (Signed)
[place]  Patient:    [name], [name] Visit Number: [id number] MRN: [id number]          Service Type: MED Location: 5500 [phone] Attending Physician:  Levy Sjogren Dictated by:   Jennette Kettle, M.D. Admit Date:  10/12/2001 Discharge Date: 10/14/2001   CC:         [facility] on [address] in [place], [place]   Discharge Summary  DISCHARGE DIAGNOSES:  1. Chronic obstructive pulmonary disease/asthma exacerbation.  2. Upper respiratory tract infection versus inflammation such as a     pneumonitis.  3. Cardiac ischemia with elevated cardiac enzymes and EKG changes in inferior     leads.  4. Positive cocaine on urine drug screen, resulting in #3.  5. History of asthma.  6. History of chronic obstructive pulmonary disease with chest x-rays     revealing left upper lobe scarring and large bulla.  7. History of tobacco use.  8. History of alcohol excess in the past.  9. 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 10. History of hypertension. 11. Disability secondary to comorbidities.  DISCHARGE MEDICATIONS: 1. Sterapred 10 mg tablets steroid taper package (the patient is to take for    the next six days as directed). 2. Doxycycline 100 mg p.o. b.i.d. x5 days, to complete course of antibiotics. 3. The patient is to continue the following medications:  Albuterol inhaler    one or two puffs q.6h. p.r.n. shortness of breath, Atrovent inhaler 0.5 mg    two puffs t.i.d. to q.i.d., Advair diskus 250/50 one puff p.o. b.i.d.,    Diovan 160 mg p.o. q.d., Clarinex 5 mg p.o. q.d., Singular 10 mg p.o. q.d.,    sulfasalazine 500 mg p.o. q.d.  FOLLOWUP:  The patient is to follow up with [facility] as previously scheduled.  DISCHARGE INSTRUCTIONS:  The patient is to avoid tobacco and recreational drugs, especially cocaine, as this may cause coronary vasospasm.  HISTORY OF PRESENT ILLNESS:  Briefly, [name] is a  50 year old African-American male with a history of asthma and chronic obstructive pulmonary disease with prior hospitalizations for exacerbation in January 2003, as well as September and November 2002.  The patient also has hypertension, seasonal allergies, tobacco use, as well as other comorbidities as outlined above.  The patient presented on October 12, 2001, in the emergency room complaining of increased shortness of breath and wheezing.  Prior to his admission, he states he has had 2 to 3 days of decreased peak flows into the low 200s.  His normal is 250 to 300s.  The patient states he ran out of his Atrovent and Advair discus three days ago.  Denies any fever, chills, nausea, vomiting.  Does have a chronic cough with congestion.  The patient complained of some chest tightness, but denied any chest pain.  There was no diaphoresis, no radiating pains.  The patient denied any abdominal discomfort.  ADMISSION LABORATORY DATA:  The patients initial electrolytes revealed a sodium of 137, potassium 4.0, chloride 106, bicarbonate 23, glucose 90, BUN 7, creatinine 0.7, calcium 0.9.  The patients CBC revealed a white count of 10.0, hemoglobin 15.1, platelets 307, with normal differential.  Urine drug screen was positive for cocaine.  The patients urinalysis was essentially negative other than 15 ketones.  The patients initial cardiac enzymes revealed a CK of 230, CK-MB of 4.5, relative index of 2.0.  The patients initial coags revealed a PT of 15.2, INR 1.3.  The  patient had an ABG on room air initially showing a pH of 7.324, PCO2 of 43.7, PO2 was decreased at 53.0, bicarbonate 23.0, percent oxygen saturation 86%.  HOSPITAL COURSE:  #1 - CHRONIC OBSTRUCTIVE PULMONARY DISEASE/ASTHMA EXACERBATION:  The patient initially was evaluated in the emergency room, and was hypoxic on his ABG on room air with a PO2 of 53.  The patient had significant wheezing and chest tightness upon lung auscultation.   The patient was initially treated in the emergency room with steroids, and was transferred to the floor on telemetry. The patients respiratory effort subsequently improved.  The patient had a repeat ABG on day #2 of his hospital stay with his PO2 increased to 77.5 with oxygen saturation 95.4%.  At this time, the patients O2 via nasal cannula was discontinued, and the patient improved on day #3, and was stable for discharge from the hospital with room air saturations in the high 90s.  The patient was given a prescription for Sterapred steroid dose package to complete a six day course total.  The patient was also started on doxycycline to complete a 7 day total course for bronchitis versus pneumonitis.  The patient does have significant chest x-ray findings of chronic obstructive pulmonary disease with large bulla.  The patient is a young gentleman and has very significant findings on his chest x-ray.  It is strongly recommended the patient quit tobacco use.  The patient states he has quit 30 days ago.  I would encourage the patient to continue with the tobacco cessation.  Given his lung findings, it may be indicated that an alpha I antitrypsin be checked for other causes of emphysema as an outpatient.  The patient has no strong family history of emphysema, however.  #2 - ISCHEMIC CHANGES ON EKG AND CARDIAC ENZYME ELEVATIONS:  The patient was initially evaluated in the emergency room and had an EKG which was somewhat worrisome for ischemic changes in the inferior leads.  There was some ST depression and T-wave inversion.  Looking back at the patients prior hospitalizations, there was no EKG that could be found, however, he had rhythm strips that demonstrated upright T-waves in the inferior leads on the rhythm strips.  The patient did have cardiac enzymes cycled.  The first set of cardiac enzymes were negative.  However, subsequent sets of cardiac enzymes revealed a CK of 230, 203, and  finally 193.  The patients CK-MB initially was 4.5, subsequently rose to a value of 6.9 and 6.2 on the third set.  The  patients relative index initially was 2.0, and subsequently rose to 3.4 and 3.2 on the third set.  The patients troponin-I was 0.01 and 0.02 and 0.01, respectively.  Given the elevation in cardiac enzymes and EKG changes, the patient was put on nitroglycerin and heparin.  The patient denied any chest pain other than chest tightness associated with his respiratory problem.  The patient did have an urinalysis checked and was positive for cocaine.  Upon further investigation the patient initially denied any cocaine use, however, with further questioning said that the night before he had some marijuana which may have been laced with cocaine.  It is strongly recommended that the patient discontinue the cocaine use.  The patient remained without chest pain throughout his hospital stay.  Upon discharge, the patient had a fourth set of cardiac enzymes checked with a CK of 62, CK-MB 3.3, troponin-I 0.02.  These were decreased from the prior values.  The patient also had repeat EKGs which  showed improvement/normalization of the ischemic changes that were previously seen in the inferior leads.  The patients EKG and ischemic changes on cardiac enzymes were likely secondary to his cocaine use.  No further intervention for coronary artery disease was necessary.  The patient was not started on a beta blocker given his cocaine use on urine drug screen.  Could consider calcium channel blockers as an outpatient if the patient does become symptomatic with chest pain.  But again, the patient had no chest pain while in the hospital.  #3 - HISTORY OF SEASONAL ALLERGIES/RHINITIS/POSSIBLE PNEUMONITIS:  The patient is to continue with his Claritin and Singular.  The patient was given a prescription for doxycycline to complete a total 7 day course.  #4 - HISTORY OF ARTHRITIS:  The patient was  maintained on his sulfasalazine while in the hospital.  DISCHARGE LABORATORY DATA:  The patients CBC upon discharge revealed a white count of 13.1, hemoglobin 13.0, platelets 319 (of note, this is while the patient was on steroids).  The patients last set of cardiac enzymes on the day of discharge revealed a CK of 62, CK-MB 3.3, troponin-I 0.02.  The patients most recent ABG performed the day prior to discharge revealed a pH of 7.4, PCO2 of 35.3, PO2 77.5, bicarbonate 23.1, oxygen saturation 95.4%. Dictated by:   Jennette Kettle, M.D. Attending Physician:  Levy Sjogren DD:  10/14/01 TD:  10/14/01 Job: [id number]

## 2010-12-02 NOTE — Discharge Summary (Signed)
[name], [name] NO.:  [id number]   MEDICAL RECORD NO.:  [id number]          PATIENT TYPE:  INP   LOCATION:  3713                         FACILITY:  [facility]   PHYSICIAN:  Hillery Aldo, M.D.   DATE OF BIRTH:  1961-06-11   DATE OF ADMISSION:  04/06/2005  DATE OF DISCHARGE:  04/08/2005                                 DISCHARGE SUMMARY   PRIMARY CARE PHYSICIAN:  Dr. Dow Adolph of [facility].   DISCHARGE DIAGNOSES:  1.  Acute asthmatic attack.  2.  Hypertension.  3.  Polysubstance abuse with urine drug screen positive for cocaine.  4.  History of rheumatoid arthritis.  5.  Allergic rhinitis.  6.  Tobacco abuse.  7.  Right lower lobe pneumonia.  8.  Hyperglycemia secondary to steroids.  9.  Hypothyroidism with a TSH of 0.303.   DISCHARGE MEDICATIONS:  1.  Advair 250/50 one puff b.i.d.  2.  Diovan 160 mg daily.  3.  Prednisone taper 60 mg two off over six days.  4.  Avelox 400 mg times seven more days.  5.  Claritin 10 mg daily.  6.  Nasonex two sprays per nostril daily.  7.  Atrovent two puffs q.6h.  8.  Albuterol two puffs q.6h.  9.  Robitussin DM two teaspoons q.4h. p.r.n.  10. Ambien 5 mg q.h.s. times one week.   CONSULTATIONS:  None.   PROCEDURES AND DIAGNOSTIC STUDIES:  Chest x-ray on April 06, 2005,  showed emphysema plus/minus right lower lobe pneumonia.   DISCHARGE LABORATORY VALUES:  CBC showed a white blood cell count of 11.6,  hemoglobin 12.7, hematocrit 37.6, and platelet count 316,000.  Sodium was  136, potassium 3.8, chloride 105, bicarbonate 24, BUN 7, creatinine 0.9,  glucose 165.  Blood cultures are negative at the time of dictation. Urine  drug screen positive for cocaine. TSH was 0.303.   HOSPITAL COURSE:   PROBLEM #1:  Acute asthma exacerbation: The patient was admitted and treated  with IV Solu-Medrol and aggressive nebulized bronchodilator therapy. We felt  that his exacerbation was multifactorial and  likely secondary to  noncompliance with his home regimen of Advair given the copayment of $9 per  month which he said was costly for him and smoking crack cocaine.  His  wheezing dramatically improved with IV Solu-Medrol  and he was quickly  tapered. Then 48 hours after admission he had no further wheezing and is  stable for discharge.   PROBLEM #2:  Tobacco abuse: The patient was counseled about cessation and  given a nicotine patch while in the hospital. He will be referred for  outpatient counseling for further attempts to help him quit smoking.   PROBLEM #3:  Hyperglycemia: The patient did have hyperglycemia thought to be  secondary to steroids. He was placed on sliding scale insulin to cover his  elevated glucoses.  He will be advised to maintain a low carbohydrate while  he is on his prednisone taper.   PROBLEM #4:  Right lower lobe pneumonia: The patient completed three days of  Avelox therapy while in  the hospital and will continue for an additional  seven days. He is afebrile with a white blood cell count that is trending  down.   PROBLEM #5:  Hypothyroidism: A TSH was checked and found to be low at 0.303.  He will need a follow-up TSH and free T4 check in approximately six weeks by  his chest pain. Clinically,  he is euthyroid.   PROBLEM #6:  Allergic rhinitis: The patient was placed on Claritin and  Flonase with good control of his symptoms.   PROBLEM #7:  Polysubstance abuse: The patient will be referred for alcohol  and drug services by the social worker prior to his discharge.   CONDITION ON DISCHARGE:  Improved.           ______________________________  Hillery Aldo, M.D.     CR/MEDQ  D:  04/08/2005  T:  04/10/2005  Job:  [id number]   cc:   Maurice March, M.D.  Fax: [phone]

## 2010-12-02 NOTE — Discharge Summary (Signed)
NAME:  [name], [name]                           ACCOUNT NO.:  [id number]   MEDICAL RECORD NO.:  [id number]                   PATIENT TYPE:  INP   LOCATION:  5018                                 FACILITY:  [facility]   PHYSICIAN:  Artist Beach, MD                     DATE OF BIRTH:  March 16, 1961   DATE OF ADMISSION:  02/23/2004  DATE OF DISCHARGE:  02/25/2004                                 DISCHARGE SUMMARY   MEDICATIONS ON DISCHARGE:  1. Avelox 500 mg for 6 days.  2. Prednisone 60 mg dose beginning today and tapering over 5 days, so that     is 50 mg on second, 40 mg on third, 30 mg on fourth, 20 mg on fifth, and     10 mg on sixth day.  3. __________ 180 mg daily.  4. Albuterol nebulizer treatment 4 times a day or as required.  5. Atrovent nebulizer treatment 4 times or as required.  6. Sulfasalazine 500 mg daily.  7. Advair Diskus b.i.d. 500 mg.   PROBLEM LIST:  1. Chronic obstructive pulmonary disease exacerbation.  2. Hypertension.  3. History of polysubstance abuse.  4. History of rheumatoid arthritis.   PRIMARY PHYSICIAN:  Dr. Dow Adolph at [facility].   PROCEDURE:  The patient had a chest x-ray done during this admission which  showed stable COPD with scarring and bleb formation.  No acute infective  cause found, no pneumonia.   CONSULTS:  There were no consults on this patient.   HISTORY AND PHYSICAL:  [name] is a 50 year old African-American male  patient who with a past history significant for COPD diagnosed 5 years ago,  hypertension diagnosed 5 years ago, rheumatoid arthritis diagnosed in 2003,  and history of polysubstance abuse presented to the ED via EMS with  complaint of shortness of breath worsening over 2 weeks acutely morning of  day of admission with complaint of tightness of chest, cough productive of  yellow sputum, no blood in sputum.  No history of fever, chills, decreased  appetite, weight loss, palpitations, chest pain, or diaphoresis.  No  history  of sick contacts.  No history of recent travel, blood exposure,  environmental changes.  The patient who presented to ED, treated with  steroids and albuterol inhaler but did not improve.  Vitals on presentation:  Temperature 98.7, pulse 100, blood pressure 173/120, saturating 97% on 2 L,  respiratory rate 24, CBG of 151.  On examination of respiratory system air  entry bilaterally equal, coarse rales present diffusely all over with  wheezes present on long expiratory phase.  CVS:  S1 & S2 normal, S4 gallop  present, no murmur.  Abdomen soft, nontender, no organomegaly, bowel sounds  present.  Extremities:  Warm, +2 pulses, no edema.   The patient improved greatly with Solu-Medrol 100 mg b.i.d.,  albuterol/Atrovent nebulizer treatment were also started presuming  COPD  exacerbation.   LABORATORY DATA ON DISCHARGE:  BMET:  Sodium 138, potassium 3.6, chloride  102, bicarb 27, BUN 7, creatinine 0.7, glucose 116.  Hemoglobin 14.5,  hematocrit 42.6, WBC 15.1, platelets 319.  EKG showed LVH pattern, right  atrial enlargement. ABG:  PH of 7.4, PCO2 48, PO2 117, bicarb 34.  Liver  function tests were normal.   The patient is to follow with Dr. Dow Adolph at [facility].                                                Artist Beach, MD    SP/MEDQ  D:  02/26/2004  T:  02/27/2004  Job:  [id number]   cc:   Maurice March, M.D.  [address] [place]  [place] [address]  Fax: [phone]

## 2010-12-02 NOTE — Discharge Summary (Signed)
[facility]  Patient:    [name], [name] Visit Number: [id number] MRN: [id number]          Service Type: Attending:  Fransisco Hertz, M.D. Dictated by:   Kerrie Pleasure, M.D. Adm. Date:  03/24/01 Disc. Date: 03/27/01                             Discharge Summary  DISCHARGE DIAGNOSIS:  Acute exacerbation of asthma and chronic obstructive pulmonary disease.  DISCHARGE MEDICATIONS: 1. Prednisone 20 mg b.i.d. tapered down 20 mg b.i.d. for first three days,    20 mg q.d. for three days then 10 mg q.d. for another three days. 2. ________ 10 mg q.d. 3. Advair Diskus 250/50 mg one puff b.i.d. 4. Atrovent MDI 18 mcg two to three puffs q.i.d. 5. Albuterol 2.5 mg/3 ml nebulizer solution p.r.n. three times a day. 6. Albuterol MDI 90 mcg one to two puffs t.i.d. p.r.n. 7. Doxycycline 100 mg twice a day for seven days.  DISPOSITION AND FOLLOWUP:  The patient was discharged in good health and was asked to follow up in the outpatient clinic with Dr. Mikeal Hawthorne on Thursday, April 04, 2001 at 3 p.m. at which point the patient will be evaluated for the effectiveness of his medication at home.  PROCEDURES PERFORMED:  None.  CONSULTATIONS:  None.  HISTORY OF PRESENT ILLNESS:  The patient is a 50 year old black male with history of severe COPD, asthma and tobacco abuse who presented to the ED with acute episode of shortness of breath that came on the night before admission while watching television.  Since that time he has felt tightness in his chest and has had trouble breathing.  He reported also coughing that was productive of yellow sputum without blood which started the day prior to his shortness of breath.  He denied any fever or chills or chest pain.  He typically controls his shortness of breath using his nebulizer but he was out of nebulizer for awhile and did not use it at this time.  He uses albuterol inhaler but it failed to control his symptoms.  He  therefore called EMS to take him to the hospital.  His last previous shortness of breath exacerbation requiring an ED visit was four years ago according to him although the ED record shows five previous visits for shortness of breath since July 2002.  PAST MEDICAL HISTORY:  His past medical history is significant for COPD that has been treated for four years now, hypertension, and arthritis.  MEDICATIONS:  His medications on admission include albuterol MDI that he stopped taking two days prior to admission because he ran out of it, Advair 250/50 that he takes two puffs a day, Singulair 10 mg q.d. nebulizer, albuterol nebulizer and he is also on no medicine for his hypertension.  ALLERGIES:  He had no known drug allergies.  FAMILY HISTORY:  His mom is healthy, his dad died at age of 5 from MI and his sister has diabetes, his nephew has asthma.  SOCIAL HISTORY:  He lives in [place] with his mother.  He previously worked as a Merchandiser, retail at [employer] but has been on disability for his COPD for seven years now.  He states he smoked tobacco about one-half pack per day for 15 years but he said he has quit one year prior to admission, alcohol two drinks per week and he does not drugs.  REVIEW OF SYSTEMS:  His review of systems is only pertinent for occasional headaches otherwise it was not contributory.  PHYSICAL EXAMINATION:  VITAL SIGNS:  He came with a blood pressure 189/103, pulse of 122, temperature of 98, respirations 36, O2 saturation of 100 on 2 L by mask.  GENERAL:  He was alert, oriented but in obvious respiratory distress.  HEART/LUNGS:  Pertinent physical findings included obscured heart sounds due to wheezing, distal pulses and on respiratory exam diffuse constant wheezing with the use of accessory muscle and splinting.  ABDOMEN:  His abdomen was nontender and was soft.  EXTREMITIES:  He has normal muscular strength both upper and lower extremities.  His reflexes  were normal.  ADMISSION LABORATORIES:  His admission labs included a pH of 7.25, pCO2 of 54.5, pO2 of 115, and bicarb of 24 and that is on 7 L by mask.  He also had a sodium of 135, potassium 4, chloride 107, CO2 of 25, BUN of 13, creatinine 0.7 and glucose of 137.  He had a white count of 13.5, hemoglobin 11.9 and platelets 427 with an MCV of 88.  His chest x-ray showed hyperinflated lungs, possible pleural effusion and right lung infiltrate.  HOSPITAL COURSE: #1 - SHORTNESS OF BREATH WAS ASSESSED AS ACUTE EXACERBATION OF HIS CHRONIC OBSTRUCTIVE PULMONARY DISEASE AND ASTHMA:  The patient was admitted and put on oxygen by face mask initially but was later converted to nasal cannula after the patient stabilized.  He was also put on Atrovent nebulizer 0.5 mg q.6h. He was put on doxycycline 100 mg b.i.d., albuterol inhaler 2.5 mg q.3h. p.r.n., Atrovent inhaler 0.5 mg q.3h. p.r.n., Advair Diskus 250/50 one puff b.i.d. and then Singulair 10 mg q.i.d.  Followup chest x-ray showed continued improvement in the patients status.  The patient did well and his followup ABGs showed improvement on current medication.  #2 - LUNG INFILTRATE:  Chest x-ray showed possible pleural effusion and possible right lung infiltrate although clinically speaking on history he did not indicate the possibility of pneumonia.  The patient was given doxycycline and he did well and by the time of discharge the patient was improved tremendously.  #3 - HYPERTENSION:  The patient came in with hypertension.  We could not remember which drug he was on at the time of admission however, while in the hospital, the patient was given Diovan and triamterene, hydrochlorothiazide 50/20 mg p.o. q.d. and his blood pressures stabilized.  #4 - ARTHRITIS:  The patient has history of minor stiffness and bilateral wrist pain that was possibly RA.  The patient was not put on any particular medication for these and did not complain much  during hospitalization.  #5 - OTHER PROBLEMS:  Other problems of the patient were stable and by the  point of discharge.  #6 - ANEMIA:  Another problem was anemia, which is mild and normocytic, and seemed to be possibly that of chronic disease.  He did have a history of severe anemia associated with gastric ulcers in 1999.  Alcohol blood test on the patient during this admission was negative.  CONDITION ON DISCHARGE:  The patient was discharged in good health.  DISCHARGE LABORATORIES:  On the day of discharge the patient had a white count of 8.1, hemoglobin 10.6, and platelets of 372.Dictated by:   Kerrie Pleasure, M.D. Attending:  Fransisco Hertz, M.D. DD:  05/07/01 TD:  05/09/01 Job: [id number]

## 2010-12-02 NOTE — H&P (Signed)
NAME:  [name], [name]                 ACCOUNT NO.:  [id number]   MEDICAL RECORD NO.:  [id number]          PATIENT TYPE:  INP   LOCATION:  1827                         FACILITY:  [facility]   PHYSICIAN:  Sharin Grave, MD  DATE OF BIRTH:  [birth year]   DATE OF ADMISSION:  10/17/2005  DATE OF DISCHARGE:                                HISTORY & PHYSICAL   CHIEF COMPLAINT:  Dyspnea.   HISTORY OF PRESENT ILLNESS:  Mr. Mcmillen is a 50 year old African-American  male with a history of COPD, asthma and hypertension.  He is a former  smoker.  He came to the [facility] emergency department via EMS secondary to  increased shortness of breath and wheezing that started this morning and got  progressively worse, despite the patient using his albuterol inhaler  frequently, over 4 times, in the morning.  He reports that he has been off  his Advair inhaler for several days.  He has frequent ED visits and hospital  admissions secondary to COPD and asthma exacerbation.  Detailed history is  difficult to obtain, as the patient is on BiPAP, still tachypneic and in  respiratory distress.  The patient is answering, nodding with his head, and  he denies fever or chills.  He denies chest pain or cough.  He reports  feeling hungry.  The patient has been admitted the last time in January and  treated with doxycycline, steroids and albuterol and Atrovent nebulizers for  COPD exacerbation under the family medicine physician services.  The primary  care Philander Ake is [facility].   ALLERGIES:  1.  SULFAS.  2.  CODEINE.   PAST MEDICAL HISTORY:  1.  Hypertension.  2.  Polysubstance abuse.  3.  Medication noncompliance.   MEDICATIONS:  The medications from the discharge note of January of 2007.  The patient was discharged on:  1.  Advair 500 mg/50 b.i.d.  The patient reports not using this medication.  2.  Atrovent M.D.I. two puffs q.i.d.  The patient reports not using this      medication.  3.  Albuterol  __________ q.6h. p.r.n.  The patient reports this is the only      medication he is using.  4.  He was also discharged on Diovan 160 mg p.o. daily, which apparently he      is not using either.   MEDICATIONS RECEIVED IN THE EMERGENCY DEPARTMENT:  1.  Solu-Medrol 125 mg IV x1.  2.  Magnesium sulfate 2 mg IV x1.  3.  The patient also was given an albuterol nebulizer x1.  4.  Apparently, he got a dose of dobutamine given by EMS.  Unsure about the      dose, since it is not recorded in the records.   SOCIAL HISTORY:  The patient lives with his wife.  He reports drinking beers  almost daily.  He also admits to on and off use of marijuana.  He reports  that he stopped smoking cigarettes about 3-4 months ago.  He reports that  the last time he used crack was 5 months ago.  FAMILY HISTORY:  Father died at 47 secondary to a myocardial infarction.  Mother is alive and healthy.  Apparently, as per prior records, there is a  significant family history of hypertension.   OBJECTIVE:  VITAL SIGNS:  The patient is afebrile.  Heart rate initially in  the 120s down to 105.  Respiratory rate initially 36, now down to 24.  Blood  pressure initially systolic 180s, now 145/99.  Oxygen saturation is  currently 98% on BiPAP.  GENERAL:  The patient is awake, alert, oriented x3.  He has limited speech  secondary to tachypnea.  HEENT:  Pupils equal, round and reactive to light and accommodation.  Extraocular movements are intact.  The patient has mild scleral erythema and  slight scleral icteric tint.  His oropharynx is clear.  NECK:  No thyromegaly, no lymphadenopathy, no JVD.  CARDIOVASCULAR:  He has tachycardic but regular rhythm.  No murmurs, rubs or  gallops.  He is on sinus rhythm on telemetry.  He has full symmetric femoral  and radial pulses and 1+ dorsalis pedis pulses.  LUNGS:  Supraclavicular and substernal retractions.  Decreased breath sounds  throughout bilaterally.  The patient has increased  prolonged expirations,  positive wheezing and rhonchi throughout bilaterally.  ABDOMEN:  Soft, nontender, nondistended.  Positive bowel sounds.  EXTREMITIES:  No lower extremity edema.   LABORATORY DATA:  Chest x-ray showed stable COPD and left upper lobe  scarring.  Also shows stable diffuse peribronchial thickening.  White blood  cell count of 11.1, hemoglobin of 14.2, hematocrit of 41.7, platelets 626.  Sodium 134, potassium 5.5, chloride 100, CO2 of 27, BUN 11, creatinine 0.9,  glucose 133, calcium 8.9.  AST 24, ALT 12, alkaline phosphatase 97.  The pH  is 7.24, CO2 of 69.3, PO2 of 185.  Bicarbonate is 30.  O2 saturation is 99%.  This was when the patient was just started on BiPAP.  EKG shows sinus  tachycardia with LVH and abnormal ventricular repolarization, unchanged from  prior EKGs.   ASSESSMENT AND PLAN:  1.  Chronic obstructive pulmonary disease, asthma exacerbation/status      asthmaticus.  I would continue Solu-Medrol IV 60 mg q.6h. for now.  I      gave the patient another Xopenex nebulization, as he is still      tachycardic in the emergency department, but we placed him on continued      albuterol Atrovent nebulizers, and he is still very tight.  I will admit      to an ICU bed for now and transfer later if he improves. Also will space      his nebulizers once he is more stable.  Will continue BiPAP for now, but      sedimentation rate is okay.  Will continue transfer to face mask.  Also      will check ABGs q.4h. x3 to continue to monitor his respiratory      acidosis.  Consider transition to p.o. prednisone tomorrow if stable.      Will hold antibiotic for now, as the patient is afebrile, he does not      have a cough or sputum production, and his white blood cell count is      normal.  2.  Hypertension.  Will restart home regimen with Diovan 160 mg daily.  I     will consider changing to a more affordable regime, as financial      constraints make it difficult for  the patient  to be compliant with his      medications.  I would not start beta blockers right now, as the patient      has a history of polysubstance abuse.  Will also check cardiac markers,      but the patient does not have chest pain currently.  3.  Fluids, electrolytes, and nutrition.  Will keep the patient NPO for now,      and will place IV fluids, normal saline, KVO.  Will advance his diet      later if dyspnea improves.  4.  Hyperkalemia.  Suspecting probably _________.  I would repeat a BMET      today and reassess.  5.  Polysubstance abuse.  I will check a urine drug screen.  Also, request a      social work consult for rehabilitation options, as the patient has a      history of frequent alcohol use.  I also wrote for Ativan 0.5 mg t.i.d.      p.r.n.   DISPOSITION:  Will admit the patient to telemetry ICU bed for now.  Consider  transfer to step-down later if his symptoms improve.      Sharin Grave, MD     AM/MEDQ  D:  10/17/2005  T:  10/17/2005  Job:  [id number]

## 2010-12-02 NOTE — H&P (Signed)
NAME:  [name], [name]                           ACCOUNT NO.:  [id number]   MEDICAL RECORD NO.:  [id number]                   PATIENT TYPE:  INP   LOCATION:  1829                                 FACILITY:  [facility]   PHYSICIAN:  Kela Millin, M.D.             DATE OF BIRTH:  1960/12/14   DATE OF ADMISSION:  09/13/2003  DATE OF DISCHARGE:                                HISTORY & PHYSICAL   PRIMARY CARE PHYSICIAN:  [facility]   CHIEF COMPLAINT:  Increasing chest tightness and shortness of breath x3  days.   HISTORY OF PRESENT ILLNESS:  The patient is a 50 year old black male with  past medical history significant for asthma who presented with complaints of  increasing shortness of breath as well as chest tightness for the past 3  days.  He states that the shortness of breath is exertional and he also  reports a cough that is productive of whitish sputum.  He denies fevers,  hemoptysis, nausea, vomiting, pedal edema and orthopnea.  Question PND.  The  patient also denies melena, hematochezia, dysuria and hematemesis.   The patient reports that he has been out of his Advair for some time and  also states that he just finished a prednisone taper last night.   PAST MEDICAL HISTORY:  1. As above.  2. Hypertension.   MEDICATIONS:  1. Diovan--does not remember the dose.  2. Prednisone taper completed last p.m.  3. Albuterol nebulizers.  4. Singulair 10 mg daily.   ALLERGIES:  NKDA.   SOCIAL HISTORY:  He quit tobacco 6 months ago after smoking 2 packs per day  x5 to 10 years.  He states that he quit marijuana 12 years ago and he drinks  beer occasionally.   FAMILY HISTORY:  His dad died of an MI at age 69.   REVIEW OF SYSTEMS:  As per HPI, all other comprehensive review of systems  negative.   PHYSICAL EXAMINATION:  GENERAL:  The patient is a middle-aged black male, in  respiratory distress using accessory muscles of respiration.  VITAL SIGNS:  His blood pressure 141/84,  pulse is 117, respiratory rate is  32 and the O2 saturation is 90%, afebrile.  HEENT:  PERRL, normocephalic, atraumatic, sclerae anicteric. Oropharynx--  moist mucous membranes, no oral exudates.  NECK:  Supple, no adenopathy, no thyromegaly,  no carotid bruits appreciated  and no JVD.  CARDIOVASCULAR:  Normal S1 & S2 tachycardic, regular rhythm, no S3  appreciated.  LUNGS:  Diffuse wheezing bilaterally.  ABDOMEN:  Soft, bowel sounds present, nontender and nondistended.  No  organomegaly and no masses palpable.  EXTREMITIES:  No cyanosis, no edema.  NEUROLOGICAL:  Alert and oriented x3, cranial nerves II-XII grossly intact,  nonfocal exam.   LABORATORY DATA:  Chest x-ray--(?) lung infiltrate.  Cardiac enzymes  negative.  Sodium is 137, potassium 3.5, chloride is 100, BUN 8.  The  glucose  is 125 and his hemoglobin is 15.6, the hematocrit is 46.  The pH is  7.3.   ASSESSMENT AND PLAN:  1. Asthma/chronic obstructive pulmonary disease exacerbation--the patient     has a history of asthma but also has a history of tobacco use.  Will     treat with bronchodilators, Xopenex and Atrovent.  Will also start the     patient on IV Solu-Medrol and IV Avelox.  We will continue Advair and     Singulair.  The chest x-ray shows a questionable infiltrate--review chest     x-ray with a radiologist, will obtain blood cultures and follow.  Will     also treat the patient's cough symptomatically with Humibid.  The patient     does not have any upper respiratory infection symptoms at this time.     Will monitor the patient on telemetry, also obtain an EKG and cardiac     enzymes as above.  Follow up chest x-ray ordered for the morning--follow     and review.  2. Hypertension--will continue Diovan and monitor.                                                Kela Millin, M.D.    ACV/MEDQ  D:  09/14/2003  T:  09/14/2003  Job:  [id number]

## 2010-12-13 ENCOUNTER — Emergency Department (HOSPITAL_COMMUNITY)
Admission: EM | Admit: 2010-12-13 | Discharge: 2010-12-13 | Disposition: A | Discharge status: Home / Self Care | Payer: Medicare Other | Attending: Emergency Medicine | Admitting: Emergency Medicine

## 2010-12-13 DIAGNOSIS — R059 Cough, unspecified: Secondary | ICD-10-CM | POA: Insufficient documentation

## 2010-12-13 DIAGNOSIS — M129 Arthropathy, unspecified: Secondary | ICD-10-CM | POA: Insufficient documentation

## 2010-12-13 DIAGNOSIS — I10 Essential (primary) hypertension: Secondary | ICD-10-CM | POA: Insufficient documentation

## 2010-12-13 DIAGNOSIS — Z760 Encounter for issue of repeat prescription: Secondary | ICD-10-CM | POA: Insufficient documentation

## 2010-12-13 DIAGNOSIS — R0602 Shortness of breath: Secondary | ICD-10-CM | POA: Insufficient documentation

## 2010-12-13 DIAGNOSIS — R0989 Other specified symptoms and signs involving the circulatory and respiratory systems: Secondary | ICD-10-CM | POA: Insufficient documentation

## 2010-12-13 DIAGNOSIS — Z79899 Other long term (current) drug therapy: Secondary | ICD-10-CM | POA: Insufficient documentation

## 2010-12-13 DIAGNOSIS — R0789 Other chest pain: Secondary | ICD-10-CM | POA: Insufficient documentation

## 2010-12-13 DIAGNOSIS — R05 Cough: Secondary | ICD-10-CM | POA: Insufficient documentation

## 2010-12-13 DIAGNOSIS — J45909 Unspecified asthma, uncomplicated: Secondary | ICD-10-CM | POA: Insufficient documentation

## 2010-12-13 DIAGNOSIS — R0609 Other forms of dyspnea: Secondary | ICD-10-CM | POA: Insufficient documentation

## 2011-01-23 ENCOUNTER — Emergency Department (HOSPITAL_COMMUNITY)
Admission: EM | Admit: 2011-01-23 | Discharge: 2011-01-23 | Disposition: A | Discharge status: Home / Self Care | Payer: Medicare Other | Attending: Emergency Medicine | Admitting: Emergency Medicine

## 2011-01-23 ENCOUNTER — Emergency Department (HOSPITAL_COMMUNITY): Payer: Medicare Other

## 2011-01-23 DIAGNOSIS — R911 Solitary pulmonary nodule: Secondary | ICD-10-CM | POA: Insufficient documentation

## 2011-01-23 DIAGNOSIS — I10 Essential (primary) hypertension: Secondary | ICD-10-CM | POA: Insufficient documentation

## 2011-01-23 DIAGNOSIS — R062 Wheezing: Secondary | ICD-10-CM | POA: Insufficient documentation

## 2011-01-23 DIAGNOSIS — Z87891 Personal history of nicotine dependence: Secondary | ICD-10-CM | POA: Insufficient documentation

## 2011-01-23 DIAGNOSIS — Z79899 Other long term (current) drug therapy: Secondary | ICD-10-CM | POA: Insufficient documentation

## 2011-01-23 DIAGNOSIS — J9801 Acute bronchospasm: Secondary | ICD-10-CM | POA: Insufficient documentation

## 2011-04-20 LAB — COMPREHENSIVE METABOLIC PANEL
AST: 20 U/L (ref 0–37)
Albumin: 3.7 g/dL (ref 3.5–5.2)
Alkaline Phosphatase: 53 U/L (ref 39–117)
CO2: 22 mEq/L (ref 19–32)
Chloride: 101 mEq/L (ref 96–112)
GFR calc Af Amer: >60 mL/min (ref >60)
Potassium: 4.4 mEq/L (ref 3.5–5.1)
Total Bilirubin: 0.6 mg/dL (ref 0.3–1.2)

## 2011-04-20 LAB — EXPECTORATED SPUTUM ASSESSMENT W GRAM STAIN, RFLX TO RESP C

## 2011-04-20 LAB — CBC
HCT: 42 % (ref 39.0–52.0)
HCT: 46.4 % (ref 39.0–52.0)
Hemoglobin: 15.3 g/dL (ref 13.0–17.0)
MCHC: 33 g/dL (ref 30.0–36.0)
MCV: 95.5 fL (ref 78.0–100.0)
Platelets: 277 10*3/uL (ref 150–400)
Platelets: 303 10*3/uL (ref 150–400)
RBC: 4.44 MIL/uL (ref 4.22–5.81)
RBC: 4.86 MIL/uL (ref 4.22–5.81)
RDW: 14.6 % (ref 11.5–15.5)
WBC: 8.1 10*3/uL (ref 4.0–10.5)
WBC: 9.5 K/uL (ref 4.0–10.5)

## 2011-04-20 LAB — LIPID PANEL
Total CHOL/HDL Ratio: 2.3 RATIO
VLDL: 9 mg/dL (ref 0–40)

## 2011-04-20 LAB — DIFFERENTIAL
Basophils Absolute: 0 10*3/uL (ref 0.0–0.1)
Basophils Relative: 0 % (ref 0–1)
Eosinophils Absolute: 0.4 K/uL (ref 0.0–0.7)
Eosinophils Relative: 4 % (ref 0–5)
Lymphocytes Relative: 8 % — ABNORMAL LOW (ref 12–46)
Lymphs Abs: 0.8 K/uL (ref 0.7–4.0)
Monocytes Absolute: 1.5 10*3/uL — ABNORMAL HIGH (ref 0.1–1.0)
Monocytes Relative: 16 % — ABNORMAL HIGH (ref 3–12)
Neutro Abs: 6.7 10*3/uL (ref 1.7–7.7)
Neutrophils Relative %: 71 % (ref 43–77)

## 2011-04-20 LAB — POCT I-STAT 3, VENOUS BLOOD GAS (G3P V)
Acid-base deficit: 3 mmol/L — ABNORMAL HIGH (ref 0.0–2.0)
Patient temperature: 99.2
pH, Ven: 7.342 — ABNORMAL HIGH (ref 7.250–7.300)
pO2, Ven: 27 mmHg — CL (ref 30.0–45.0)

## 2011-05-15 ENCOUNTER — Emergency Department (HOSPITAL_COMMUNITY)
Admission: EM | Admit: 2011-05-15 | Discharge: 2011-05-15 | Disposition: A | Discharge status: Home / Self Care | Payer: Medicare Other | Attending: Emergency Medicine | Admitting: Emergency Medicine

## 2011-05-15 ENCOUNTER — Emergency Department (HOSPITAL_COMMUNITY): Payer: Medicare Other

## 2011-05-15 DIAGNOSIS — I10 Essential (primary) hypertension: Secondary | ICD-10-CM | POA: Insufficient documentation

## 2011-05-15 DIAGNOSIS — M129 Arthropathy, unspecified: Secondary | ICD-10-CM | POA: Insufficient documentation

## 2011-05-15 DIAGNOSIS — Z79899 Other long term (current) drug therapy: Secondary | ICD-10-CM | POA: Insufficient documentation

## 2011-05-15 DIAGNOSIS — R0602 Shortness of breath: Secondary | ICD-10-CM | POA: Insufficient documentation

## 2011-05-15 DIAGNOSIS — J45901 Unspecified asthma with (acute) exacerbation: Secondary | ICD-10-CM | POA: Insufficient documentation

## 2011-06-09 ENCOUNTER — Emergency Department (HOSPITAL_COMMUNITY)
Admission: EM | Admit: 2011-06-09 | Discharge: 2011-06-09 | Disposition: A | Discharge status: Home / Self Care | Payer: Medicare Other | Attending: Emergency Medicine | Admitting: Emergency Medicine

## 2011-06-09 ENCOUNTER — Encounter: Payer: Self-pay | Admitting: *Deleted

## 2011-06-09 ENCOUNTER — Emergency Department (HOSPITAL_COMMUNITY): Payer: Medicare Other

## 2011-06-09 DIAGNOSIS — J441 Chronic obstructive pulmonary disease with (acute) exacerbation: Secondary | ICD-10-CM | POA: Insufficient documentation

## 2011-06-09 DIAGNOSIS — M129 Arthropathy, unspecified: Secondary | ICD-10-CM | POA: Insufficient documentation

## 2011-06-09 DIAGNOSIS — R059 Cough, unspecified: Secondary | ICD-10-CM | POA: Insufficient documentation

## 2011-06-09 DIAGNOSIS — R05 Cough: Secondary | ICD-10-CM | POA: Insufficient documentation

## 2011-06-09 DIAGNOSIS — R0789 Other chest pain: Secondary | ICD-10-CM | POA: Insufficient documentation

## 2011-06-09 DIAGNOSIS — I10 Essential (primary) hypertension: Secondary | ICD-10-CM | POA: Insufficient documentation

## 2011-06-09 DIAGNOSIS — J449 Chronic obstructive pulmonary disease, unspecified: Secondary | ICD-10-CM

## 2011-06-09 DIAGNOSIS — Z79899 Other long term (current) drug therapy: Secondary | ICD-10-CM | POA: Insufficient documentation

## 2011-06-09 DIAGNOSIS — R5381 Other malaise: Secondary | ICD-10-CM | POA: Insufficient documentation

## 2011-06-09 DIAGNOSIS — R0602 Shortness of breath: Secondary | ICD-10-CM | POA: Insufficient documentation

## 2011-06-09 HISTORY — DX: Chronic obstructive pulmonary disease, unspecified: J44.9

## 2011-06-09 HISTORY — DX: Unspecified osteoarthritis, unspecified site: M19.90

## 2011-06-09 HISTORY — DX: Essential (primary) hypertension: I10

## 2011-06-09 MED ORDER — IPRATROPIUM BROMIDE 0.02 % IN SOLN
0.5000 mg | Freq: Once | RESPIRATORY_TRACT | Status: AC
  Administered 2011-06-09: 0.5 mg via RESPIRATORY_TRACT
  Filled 2011-06-09: qty 2.5

## 2011-06-09 MED ORDER — IPRATROPIUM BROMIDE 0.02 % IN SOLN
0.5000 mg | Freq: Once | RESPIRATORY_TRACT | Status: AC
  Administered 2011-06-09: 0.5 mg via RESPIRATORY_TRACT

## 2011-06-09 MED ORDER — ALBUTEROL SULFATE (5 MG/ML) 0.5% IN NEBU: 2.5000 mg | INHALATION_SOLUTION | Freq: Four times a day (QID) | RESPIRATORY_TRACT | Status: DC | PRN

## 2011-06-09 MED ORDER — ALBUTEROL SULFATE (5 MG/ML) 0.5% IN NEBU
2.5000 mg | INHALATION_SOLUTION | Freq: Once | RESPIRATORY_TRACT | Status: AC
  Administered 2011-06-09: 2.5 mg via RESPIRATORY_TRACT

## 2011-06-09 MED ORDER — ALBUTEROL SULFATE (5 MG/ML) 0.5% IN NEBU
INHALATION_SOLUTION | RESPIRATORY_TRACT | Status: AC
  Filled 2011-06-09: qty 1

## 2011-06-09 MED ORDER — ALBUTEROL SULFATE (5 MG/ML) 0.5% IN NEBU
2.5000 mg | INHALATION_SOLUTION | Freq: Once | RESPIRATORY_TRACT | Status: AC
  Administered 2011-06-09: 2.5 mg via RESPIRATORY_TRACT
  Filled 2011-06-09: qty 0.5

## 2011-06-09 MED ORDER — IPRATROPIUM BROMIDE 0.02 % IN SOLN
RESPIRATORY_TRACT | Status: AC
  Filled 2011-06-09: qty 2.5

## 2011-06-09 MED ORDER — FLUTICASONE-SALMETEROL 500-50 MCG/DOSE IN AEPB: 1.0000 | INHALATION_SPRAY | Freq: Two times a day (BID) | RESPIRATORY_TRACT | Status: DC

## 2011-06-09 MED ORDER — TIOTROPIUM BROMIDE MONOHYDRATE 18 MCG IN CAPS: 18.0000 ug | ORAL_CAPSULE | Freq: Every day | RESPIRATORY_TRACT | Status: DC

## 2011-06-09 MED ORDER — PREDNISONE 10 MG PO TABS: 5.0000 mg | ORAL_TABLET | Freq: Every day | ORAL | Status: AC

## 2011-06-09 MED ORDER — METHYLPREDNISOLONE SODIUM SUCC 125 MG IJ SOLR
125.0000 mg | Freq: Once | INTRAMUSCULAR | Status: AC
  Administered 2011-06-09: 125 mg via INTRAMUSCULAR
  Filled 2011-06-09: qty 2

## 2011-06-09 NOTE — ED Notes (Signed)
Pt states feels better, no distress. Few scattered wheezes noted. States is never completely clear, denies c/o. Ate all of lunch and tolerated well.

## 2011-06-09 NOTE — ED Provider Notes (Signed)
History     CSN: [id number] Arrival date & time: 06/09/2011 11:32 AM   First MD Initiated Contact with Patient 06/09/11 1254      Chief Complaint  Patient presents with  . Shortness of Breath    (Consider location/radiation/quality/duration/timing/severity/associated sxs/prior treatment) Patient is a 50 y.o. male presenting with shortness of breath. The history is provided by the patient.  Shortness of Breath  The current episode started yesterday. The onset was gradual. The problem occurs continuously. The problem has been gradually worsening. The problem is moderate. The symptoms are aggravated by activity. Associated symptoms include cough, shortness of breath and wheezing. Pertinent negatives include no chest pain, no chest pressure, no orthopnea, no fever, no rhinorrhea, no sore throat and no stridor. He is currently using steroids.  Patient has PMH of severe COPD. His normal regimen includes daily 5 mg prednisone, Spiriva, Advair, and PRN albuterol. He is followed by Dr. Audria Nine, and states he has a f/u with her next month but completely ran out of all of his medicine on Tuesday. He awoke Weds morning with worsening SOB than his baseline. This has worsened over the past 2 days, and is worsened with ambulation. He also has a sensation of chest "tightness" which he states is c/w previous COPD exacerbations. Denies fever, chills, diaphoresis, n/v, rhinorrhea.  Past Medical History  Diagnosis Date  . Hypertension   . Arthritis   . COPD (chronic obstructive pulmonary disease)     Past Surgical History  Procedure Date  . Replacement total knee     No family history on file.  History  Substance Use Topics  . Smoking status: Former Games developer  . Smokeless tobacco: Not on file  . Alcohol Use: Yes      Review of Systems  Constitutional: Positive for fatigue. Negative for fever, chills and diaphoresis.  HENT: Negative.  Negative for sore throat and rhinorrhea.   Eyes: Negative.    Respiratory: Positive for cough, chest tightness, shortness of breath and wheezing. Negative for apnea, choking and stridor.   Cardiovascular: Negative for chest pain, palpitations, orthopnea and leg swelling.  Gastrointestinal: Negative for nausea, vomiting and abdominal pain.  Musculoskeletal: Negative.   Skin: Negative.   Neurological: Negative for dizziness, weakness, light-headedness and headaches.    Allergies  Review of patient's allergies indicates no known allergies.  Home Medications   Current Outpatient Rx  Name Route Sig Dispense Refill  . ALBUTEROL SULFATE HFA 108 (90 BASE) MCG/ACT IN AERS Inhalation Inhale 2 puffs into the lungs every 6 (six) hours as needed. For shortness of breath and cough     . FLUTICASONE PROPIONATE 50 MCG/ACT NA SUSP Nasal Place 2 sprays into the nose at bedtime.      Marland Kitchen FLUTICASONE-SALMETEROL 500-50 MCG/DOSE IN AEPB Inhalation Inhale 1 puff into the lungs every 12 (twelve) hours.      Marland Kitchen HYDROCODONE-ACETAMINOPHEN 10-325 MG PO TABS Oral Take 1 tablet by mouth every 8 (eight) hours as needed. For pain     . MONTELUKAST SODIUM 10 MG PO TABS Oral Take 10 mg by mouth at bedtime.      Marland Kitchen PREDNISONE 5 MG PO TABS Oral Take 5 mg by mouth daily.      Marland Kitchen TIOTROPIUM BROMIDE MONOHYDRATE 18 MCG IN CAPS Inhalation Place 18 mcg into inhaler and inhale daily.        BP 155/107  Pulse 98  Temp(Src) 98.7 F (37.1 C) (Oral)  Resp 18  SpO2 96%  Physical Exam  Nursing  note and vitals reviewed. Constitutional: He is oriented to person, place, and time. He appears well-developed and well-nourished. No distress.  HENT:  Head: Normocephalic and atraumatic.  Right Ear: External ear normal.  Left Ear: External ear normal.  Mouth/Throat: Oropharynx is clear and moist.  Eyes: Conjunctivae are normal. Pupils are equal, round, and reactive to light.  Neck: Normal range of motion.  Cardiovascular: Normal rate, regular rhythm and normal heart sounds.   Pulmonary/Chest:  Accessory muscle usage present. Not tachypneic. No respiratory distress. He has wheezes. He has rhonchi. He exhibits no tenderness.       Diffuse wheezes, occasional rhonchi  Abdominal: Soft. Bowel sounds are normal. There is no tenderness.  Musculoskeletal: Normal range of motion.  Neurological: He is alert and oriented to person, place, and time.  Skin: Skin is warm and dry. He is not diaphoretic.  Psychiatric: He has a normal mood and affect.    ED Course  Procedures (including critical care time)  Labs Reviewed - No data to display Dg Chest 2 View  06/09/2011  *RADIOLOGY REPORT*  Clinical Data: Shortness of breath, chest tightness, history of asthma  CHEST - 2 VIEW  Comparison: 05/15/2011; 01/23/2011; 08/11/2006; 06/11/2006; chest CT - 06/11/2006  Findings:  Unchanged cardiac silhouette and mediastinal contours. The lungs remain hyperexpanded with severe emphysematous change with bilateral upper lobe reticular opacities and associated volume loss.  There is persistent blunting of the bilateral costophrenic angles.  Given background parenchymal abnormalities, there are no new discrete focal airspace opacities.  No definite pleural effusion or pneumothorax.  Grossly unchanged bones.  IMPRESSION:  Severe emphysematous change with bilateral upper lobe scarring and volume loss without a definite superimposed acute cardiopulmonary disease.  Original Report Authenticated By: Waynard Reeds, M.D.     1. COPD (chronic obstructive pulmonary disease)       MDM  2:11 PM Patient seen and assessed. He was noted to have diffuse wheezing on expiration. NAD, sats >95%. Was given a breathing treatment of albuterol and Atrovent, but still has continued wheezing. Will repeat the treatment and give IM Solu-Medrol.   Patient was re-assessed after second neb tx. States he feels much better at this time and wishes to be d/ced home. He still has some scattered wheezes to auscultation, but overall seems to be  moving air much better and appears more comfortable. (He states that he is never completely "clear.") He has a f/u with his doctor at [facility] in early December, but is out of medication. I have given him a month's supply of refills on his medications (Prednisone, Advair, albuterol, Spiriva). He was instructed on reasons to return to the ED, including but not limited to increased dyspnea, chest pain. He verbalized understanding and agreed to plan.     Grant Fontana, [place] 06/09/11 2003

## 2011-06-09 NOTE — ED Provider Notes (Signed)
Medical screening examination/treatment/procedure(s) were performed by non-physician practitioner and as supervising physician I was immediately available for consultation/collaboration.   Lyanne Co, MD 06/09/11 2203

## 2011-06-09 NOTE — ED Notes (Signed)
He is out of his meds.  He has been out for 2 days

## 2011-06-09 NOTE — ED Notes (Signed)
Patient couldn't sleep last night due to sob.  He states he woke up at 12 mn.  He has ran out of his neb treatment last night.  He cannot walk due to shortness of breath.  He also reports chest tightness.

## 2011-08-25 ENCOUNTER — Encounter (HOSPITAL_COMMUNITY): Payer: Self-pay | Admitting: Emergency Medicine

## 2011-08-25 ENCOUNTER — Emergency Department (HOSPITAL_COMMUNITY)
Admission: EM | Admit: 2011-08-25 | Discharge: 2011-08-25 | Disposition: A | Discharge status: Home / Self Care | Payer: Medicare Other | Attending: Emergency Medicine | Admitting: Emergency Medicine

## 2011-08-25 DIAGNOSIS — M129 Arthropathy, unspecified: Secondary | ICD-10-CM | POA: Insufficient documentation

## 2011-08-25 DIAGNOSIS — J4489 Other specified chronic obstructive pulmonary disease: Secondary | ICD-10-CM | POA: Insufficient documentation

## 2011-08-25 DIAGNOSIS — J449 Chronic obstructive pulmonary disease, unspecified: Secondary | ICD-10-CM

## 2011-08-25 DIAGNOSIS — Z79899 Other long term (current) drug therapy: Secondary | ICD-10-CM | POA: Insufficient documentation

## 2011-08-25 DIAGNOSIS — R0602 Shortness of breath: Secondary | ICD-10-CM | POA: Insufficient documentation

## 2011-08-25 DIAGNOSIS — I10 Essential (primary) hypertension: Secondary | ICD-10-CM | POA: Insufficient documentation

## 2011-08-25 MED ORDER — PREDNISONE 5 MG PO TABS
5.0000 mg | ORAL_TABLET | ORAL | Status: AC
  Administered 2011-08-25: 5 mg via ORAL
  Filled 2011-08-25: qty 1

## 2011-08-25 MED ORDER — ALBUTEROL SULFATE (5 MG/ML) 0.5% IN NEBU
5.0000 mg | INHALATION_SOLUTION | Freq: Once | RESPIRATORY_TRACT | Status: AC
  Administered 2011-08-25: 5 mg via RESPIRATORY_TRACT
  Filled 2011-08-25: qty 1

## 2011-08-25 NOTE — ED Provider Notes (Signed)
History     CSN: [id number]  Arrival date & time 08/25/11  1606   First MD Initiated Contact with Patient 08/25/11 1653      Chief Complaint  Patient presents with  . Shortness of Breath  . Asthma    (Consider location/radiation/quality/duration/timing/severity/associated sxs/prior treatment) HPI  Patient with hx of COPD that he states is followed by primary care physician that helps her presents to emergency department requesting medication refill stating that he has hx of COPD and recently went to PCP to have prescriptions refilled which were faxed to pharmacy but when he went to pharmacy a couple of days ago to pick up prescriptions and noticed that his daily prednisone and advair were not refilled. Patient states that he went to PCP today and was told "because you missed to many appointments in the past, you can not get those two refilled." Patient states no other explanation was offered. Patient states that since not having advair and prednisone daily he has noticed increasing SOB stating "I just need my meds." Patient denies fevers, chills, cough, CP, hemoptysis. He states his SOB feels like his normal SOB when he runs out of meds.   5:20 PM I spoke with Delorise Shiner MD at [facility] who states that patient has not been seen in clinic since July 2012, missing 5 appointments but that he has walked in numerous times requesting just medication refills which that have done a several occasions but state that they were trying to give patient incentive to keep appointments. She has authorized both advair and prednisone refills but stresses that we urge patient to keep his Feb 22 appointment at 2:45 with Dr. Andrey Campanile.   Past Medical History  Diagnosis Date  . Hypertension   . Arthritis   . COPD (chronic obstructive pulmonary disease)     Past Surgical History  Procedure Date  . Replacement total knee     History reviewed. No pertinent family history.  History  Substance Use Topics    . Smoking status: Former Games developer  . Smokeless tobacco: Not on file  . Alcohol Use: Yes      Review of Systems  All other systems reviewed and are negative.    Allergies  Review of patient's allergies indicates no known allergies.  Home Medications   Current Outpatient Rx  Name Route Sig Dispense Refill  . ALBUTEROL SULFATE HFA 108 (90 BASE) MCG/ACT IN AERS Inhalation Inhale 2 puffs into the lungs every 6 (six) hours as needed. For shortness of breath and cough     . ALBUTEROL SULFATE (5 MG/ML) 0.5% IN NEBU Nebulization Take 2.5 mg by nebulization every 6 (six) hours as needed. For shortness of breath    . FLUTICASONE PROPIONATE 50 MCG/ACT NA SUSP Nasal Place 2 sprays into the nose at bedtime.      Marland Kitchen FLUTICASONE-SALMETEROL 500-50 MCG/DOSE IN AEPB Inhalation Inhale 1 puff into the lungs every 12 (twelve) hours.      Marland Kitchen FLUTICASONE-SALMETEROL 500-50 MCG/DOSE IN AEPB Inhalation Inhale 1 puff into the lungs 2 (two) times daily.    Marland Kitchen HYDROCODONE-ACETAMINOPHEN 10-325 MG PO TABS Oral Take 1 tablet by mouth every 8 (eight) hours as needed. For pain     . MONTELUKAST SODIUM 10 MG PO TABS Oral Take 10 mg by mouth at bedtime.      Marland Kitchen PREDNISONE 5 MG PO TABS Oral Take 5 mg by mouth daily.      . SULFASALAZINE 500 MG PO TABS Oral Take 500 mg by  mouth 2 (two) times daily.    Marland Kitchen TIOTROPIUM BROMIDE MONOHYDRATE 18 MCG IN CAPS Inhalation Place 18 mcg into inhaler and inhale daily.    Marland Kitchen VALSARTAN 160 MG PO TABS Oral Take 160 mg by mouth daily.      BP 162/118  Pulse 99  Temp(Src) 98.3 F (36.8 C) (Oral)  Resp 16  SpO2 95%  Physical Exam  Vitals reviewed. Constitutional: He is oriented to person, place, and time. He appears well-developed and well-nourished. No distress.  HENT:  Head: Normocephalic and atraumatic.  Right Ear: External ear normal.  Left Ear: External ear normal.  Nose: Nose normal.  Mouth/Throat: No oropharyngeal exudate.       Mild erythema of posterior pharynx and tonsils  no tonsillar exudate or enlargement. Patent airway. Swallowing secretions well  Eyes: Conjunctivae are normal.  Neck: Normal range of motion. Neck supple.  Cardiovascular: Normal rate, regular rhythm and normal heart sounds.  Exam reveals no gallop and no friction rub.   No murmur heard. Pulmonary/Chest: Effort normal. No respiratory distress. He has wheezes. He has no rales. He exhibits no tenderness.  Abdominal: Soft. He exhibits no distension and no mass. There is no tenderness. There is no rebound and no guarding.  Musculoskeletal: He exhibits no edema and no tenderness.  Lymphadenopathy:    He has no cervical adenopathy.  Neurological: He is alert and oriented to person, place, and time. He has normal reflexes.  Skin: Skin is warm and dry. No rash noted. He is not diaphoretic.  Psychiatric: He has a normal mood and affect.    ED Course  Procedures (including critical care time)  PO prednisone and neb treatment.   Labs Reviewed - No data to display No results found.   1. COPD (chronic obstructive pulmonary disease)       MDM  Patient is afebrile, no resp distress stating mild SOB "feels like normal SOB when I dont have meds" with prescriptions authorized by Dr. Clelia Croft and waiting at pharmacy for patient. Patient is agreeable to PCP follow up for future medication needs.     Medical screening examination/treatment/procedure(s) were performed by non-physician practitioner and as supervising physician I was immediately available for consultation/collaboration. Osvaldo Human, M.D.     Jenness Corner, [place] 08/25/11 1930  Carleene Cooper III, MD 08/26/11 1140

## 2011-08-25 NOTE — ED Notes (Signed)
Pt sts SOB and asthma flare up x 2 days; pt sts PCP would not refill advair or pred

## 2011-08-25 NOTE — ED Notes (Signed)
Pt dc home in NAD. Pt states he feels better.

## 2011-08-25 NOTE — ED Notes (Signed)
Pt c/o sob and tightness in chest when walking x3 days. Pt advair and pred was not filled and pt states he needs them . Lungs clear bilat.

## 2011-11-10 ENCOUNTER — Emergency Department (HOSPITAL_COMMUNITY)
Admission: EM | Admit: 2011-11-10 | Discharge: 2011-11-10 | Disposition: A | Discharge status: Home / Self Care | Payer: Medicare Other | Attending: Emergency Medicine | Admitting: Emergency Medicine

## 2011-11-10 ENCOUNTER — Encounter (HOSPITAL_COMMUNITY): Payer: Self-pay | Admitting: *Deleted

## 2011-11-10 DIAGNOSIS — J4489 Other specified chronic obstructive pulmonary disease: Secondary | ICD-10-CM | POA: Insufficient documentation

## 2011-11-10 DIAGNOSIS — I10 Essential (primary) hypertension: Secondary | ICD-10-CM | POA: Insufficient documentation

## 2011-11-10 DIAGNOSIS — R0602 Shortness of breath: Secondary | ICD-10-CM | POA: Insufficient documentation

## 2011-11-10 DIAGNOSIS — J449 Chronic obstructive pulmonary disease, unspecified: Secondary | ICD-10-CM | POA: Insufficient documentation

## 2011-11-10 DIAGNOSIS — Z96659 Presence of unspecified artificial knee joint: Secondary | ICD-10-CM | POA: Insufficient documentation

## 2011-11-10 DIAGNOSIS — M129 Arthropathy, unspecified: Secondary | ICD-10-CM | POA: Insufficient documentation

## 2011-11-10 MED ORDER — ALBUTEROL SULFATE (5 MG/ML) 0.5% IN NEBU
5.0000 mg | INHALATION_SOLUTION | Freq: Once | RESPIRATORY_TRACT | Status: AC
  Administered 2011-11-10: 5 mg via RESPIRATORY_TRACT

## 2011-11-10 MED ORDER — PREDNISONE 20 MG PO TABS
60.0000 mg | ORAL_TABLET | Freq: Once | ORAL | Status: AC
  Administered 2011-11-10: 60 mg via ORAL
  Filled 2011-11-10: qty 3

## 2011-11-10 MED ORDER — ALBUTEROL SULFATE (5 MG/ML) 0.5% IN NEBU
INHALATION_SOLUTION | RESPIRATORY_TRACT | Status: AC
  Administered 2011-11-10: 5 mg via RESPIRATORY_TRACT
  Filled 2011-11-10: qty 1

## 2011-11-10 MED ORDER — PREDNISONE 10 MG PO TABS: 60.0000 mg | ORAL_TABLET | Freq: Every day | ORAL | Status: DC

## 2011-11-10 MED ORDER — ALBUTEROL SULFATE HFA 108 (90 BASE) MCG/ACT IN AERS
2.0000 | INHALATION_SPRAY | RESPIRATORY_TRACT | Status: DC
  Administered 2011-11-10: 2 via RESPIRATORY_TRACT
  Filled 2011-11-10: qty 6.7

## 2011-11-10 NOTE — ED Notes (Signed)
Patient was at home and had a sudden onset of Dyspnea. States that he has not had his asthma flare up in a long time. He was given albuterol 5 mg and atrovent 0.5 mg neb  And 125 mg solumedrol per EMS.  Patient continues to be short of breath. Inspiratory and expiratory wheezes noted.

## 2011-11-10 NOTE — ED Notes (Signed)
Pt denies any pain or questions upon discharge. 

## 2011-11-10 NOTE — ED Notes (Signed)
MD at bedside. 

## 2011-11-10 NOTE — Discharge Instructions (Signed)
Asthma, Acute Bronchospasm  Your exam shows you have asthma, or acute bronchospasm that acts like asthma. Bronchospasm means your air passages become narrowed. These conditions are due to inflammation and airway spasm that cause narrowing of the bronchial tubes in the lungs. This causes you to have wheezing and shortness of breath.  CAUSES    Respiratory infections and allergies most often bring on these attacks. Smoking, air pollution, cold air, emotional upsets, and vigorous exercise can also bring them on.    TREATMENT     Treatment is aimed at making the narrowed airways larger. Mild asthma/bronchospasm is usually controlled with inhaled medicines. Albuterol is a common medicine that you breathe in to open spastic or narrowed airways. Some trade names for albuterol are Ventolin or Proventil. Steroid medicine is also used to reduce the inflammation when an attack is moderate or severe. Antibiotics (medications used to kill germs) are only used if a bacterial infection is present.    If you are pregnant and need to use Albuterol (Ventolin or Proventil), you can expect the baby to move more than usual shortly after the medicine is used.   HOME CARE INSTRUCTIONS     Rest.    Drink plenty of liquids. This helps the mucus to remain thin and easily coughed up. Do not use caffeine or alcohol.    Do not smoke. Avoid being exposed to second-hand smoke.    You play a critical role in keeping yourself in good health. Avoid exposure to things that cause you to wheeze. Avoid exposure to things that cause you to have breathing problems. Keep your medications up-to-date and available. Carefully follow your doctor's treatment plan.    When pollen or pollution is bad, keep windows closed and use an air conditioner go to places with air conditioning. If you are allergic to furry pets or birds, find new homes for them or keep them outside.    Take your medicine exactly as prescribed.     Asthma requires careful medical attention. See your caregiver for follow-up as advised. If you are more than [redacted] weeks pregnant and you were prescribed any new medications, let your Obstetrician know about the visit and how you are doing. Arrange a recheck.   SEEK IMMEDIATE MEDICAL CARE IF:     You are getting worse.    You have trouble breathing. If severe, call 911.    You develop chest pain or discomfort.    You are throwing up or not drinking fluids.    You are not getting better within 24 hours.    You are coughing up yellow, green, brown, or bloody sputum.    You develop a fever over 102 F (38.9 C).    You have trouble swallowing.   MAKE SURE YOU:     Understand these instructions.    Will watch your condition.    Will get help right away if you are not doing well or get worse.   Document Released: 10/18/2006 Document Revised: 06/22/2011 Document Reviewed: 06/17/2007  [employer] Patient Information 2012 [employer]

## 2011-11-11 NOTE — ED Provider Notes (Signed)
History     CSN: [id number]  Arrival date & time 11/10/11  1845   First MD Initiated Contact with Patient 11/10/11 2030      Chief Complaint  Patient presents with  . Shortness of Breath    The history is provided by the patient.   the patient reports he became more short of breath today after he was sitting outside sweeping upon with a broom.  The patient is a history of asthma.  He reports he has albuterol at home.  He reports his breathing became severe after he had been outside.  He denies fevers or chills.  He's had no productive cough.  He denies unilateral leg swelling.  His no prior history of DVT or pulmonary embolism.  He's never been intubated for his asthma.  Has a history of hypertension arthritis as well.  Per the chart there is also a history of COPD.  The patient used to smoke cigarettes but no longer does.  He denies chest discomfort at this time.  As reported the EMS gave him a breathing treatment around with some improvement in his symptoms.  The patient also received a breathing treatment in the ER and reports he feels much better at this time.  Past Medical History  Diagnosis Date  . Hypertension   . Arthritis   . COPD (chronic obstructive pulmonary disease)   . Asthma     Past Surgical History  Procedure Date  . Replacement total knee     History reviewed. No pertinent family history.  History  Substance Use Topics  . Smoking status: Former Games developer  . Smokeless tobacco: Not on file  . Alcohol Use: Yes      Review of Systems  Respiratory: Positive for shortness of breath.   All other systems reviewed and are negative.    Allergies  Review of patient's allergies indicates no known allergies.  Home Medications   Current Outpatient Rx  Name Route Sig Dispense Refill  . ALBUTEROL SULFATE HFA 108 (90 BASE) MCG/ACT IN AERS Inhalation Inhale 2 puffs into the lungs every 6 (six) hours as needed. For shortness of breath and cough     . ALBUTEROL  SULFATE (5 MG/ML) 0.5% IN NEBU Nebulization Take 2.5 mg by nebulization every 6 (six) hours as needed. For shortness of breath    . FLUTICASONE PROPIONATE 50 MCG/ACT NA SUSP Nasal Place 2 sprays into the nose at bedtime.      Marland Kitchen FLUTICASONE-SALMETEROL 500-50 MCG/DOSE IN AEPB Inhalation Inhale 1 puff into the lungs every 12 (twelve) hours.      Marland Kitchen HYDROCODONE-ACETAMINOPHEN 10-325 MG PO TABS Oral Take 1 tablet by mouth every 8 (eight) hours as needed. For pain     . MONTELUKAST SODIUM 10 MG PO TABS Oral Take 10 mg by mouth at bedtime.      Marland Kitchen PREDNISONE 5 MG PO TABS Oral Take 5 mg by mouth daily.      . SULFASALAZINE 500 MG PO TABS Oral Take 500 mg by mouth 2 (two) times daily.    Marland Kitchen TIOTROPIUM BROMIDE MONOHYDRATE 18 MCG IN CAPS Inhalation Place 18 mcg into inhaler and inhale daily.    Marland Kitchen VALSARTAN 160 MG PO TABS Oral Take 160 mg by mouth daily.    Marland Kitchen PREDNISONE 10 MG PO TABS Oral Take 6 tablets (60 mg total) by mouth daily. 30 tablet 0    BP 167/122  Pulse 125  Temp(Src) 98 F (36.7 C) (Oral)  Resp 20  SpO2  97%  Physical Exam  Nursing note and vitals reviewed. Constitutional: He is oriented to person, place, and time. He appears well-developed and well-nourished.  HENT:  Head: Normocephalic and atraumatic.  Eyes: EOM are normal.  Neck: Normal range of motion.  Cardiovascular: Normal rate, regular rhythm, normal heart sounds and intact distal pulses.   Pulmonary/Chest: Effort normal and breath sounds normal. No respiratory distress. He has no wheezes. He has no rales.  Abdominal: Soft. He exhibits no distension. There is no tenderness.  Musculoskeletal: Normal range of motion.  Neurological: He is alert and oriented to person, place, and time.  Skin: Skin is warm and dry.  Psychiatric: He has a normal mood and affect. Judgment normal.    ED Course  Procedures (including critical care time)  Labs Reviewed - No data to display No results found.   1. Asthma       MDM  The patient  feels much better after a single prednisone treatment in emergency department.  He's been given a dose of prednisone.  He would like to go home at this time as he reports his breathing is much better.  The patient is nebulized albuterol at home.  He was also given an albuterol metered-dose inhaler in the emergency department.  The patient understands to return the emergency department for new or worsening symptoms        Lyanne Co, MD 11/11/11 0130

## 2011-12-18 ENCOUNTER — Emergency Department (HOSPITAL_COMMUNITY): Payer: Medicare Other

## 2011-12-18 ENCOUNTER — Inpatient Hospital Stay (HOSPITAL_COMMUNITY)
Admission: EM | Admit: 2011-12-18 | Discharge: 2011-12-21 | DRG: 192 | Disposition: A | Discharge status: Home / Self Care | Payer: Medicare Other | Attending: Internal Medicine | Admitting: Internal Medicine

## 2011-12-18 DIAGNOSIS — I10 Essential (primary) hypertension: Secondary | ICD-10-CM

## 2011-12-18 DIAGNOSIS — J45901 Unspecified asthma with (acute) exacerbation: Secondary | ICD-10-CM

## 2011-12-18 DIAGNOSIS — J441 Chronic obstructive pulmonary disease with (acute) exacerbation: Principal | ICD-10-CM | POA: Diagnosis present

## 2011-12-18 LAB — BASIC METABOLIC PANEL
CO2: 21 mEq/L (ref 19–32)
Calcium: 9.2 mg/dL (ref 8.4–10.5)
Chloride: 103 mEq/L (ref 96–112)
Potassium: 4 mEq/L (ref 3.5–5.1)
Sodium: 136 mEq/L (ref 135–145)

## 2011-12-18 LAB — CBC
Hemoglobin: 14.9 g/dL (ref 13.0–17.0)
Platelets: 291 10*3/uL (ref 150–400)
RBC: 4.93 MIL/uL (ref 4.22–5.81)
WBC: 11.5 10*3/uL — ABNORMAL HIGH (ref 4.0–10.5)

## 2011-12-18 LAB — POCT I-STAT TROPONIN I

## 2011-12-18 MED ORDER — ALBUTEROL SULFATE (5 MG/ML) 0.5% IN NEBU: 2.5000 mg | INHALATION_SOLUTION | RESPIRATORY_TRACT | Status: DC | PRN

## 2011-12-18 MED ORDER — FLUTICASONE-SALMETEROL 500-50 MCG/DOSE IN AEPB
1.0000 | INHALATION_SPRAY | Freq: Two times a day (BID) | RESPIRATORY_TRACT | Status: DC
  Administered 2011-12-19 – 2011-12-21 (×5): 1 via RESPIRATORY_TRACT
  Filled 2011-12-18: qty 14

## 2011-12-18 MED ORDER — HYDRALAZINE HCL 20 MG/ML IJ SOLN
5.0000 mg | Freq: Four times a day (QID) | INTRAMUSCULAR | Status: DC | PRN
  Administered 2011-12-19: 5 mg via INTRAVENOUS
  Filled 2011-12-18: qty 1
  Filled 2011-12-18: qty 0.25

## 2011-12-18 MED ORDER — METHYLPREDNISOLONE SODIUM SUCC 125 MG IJ SOLR
INTRAMUSCULAR | Status: AC
  Administered 2011-12-18: 125 mg via INTRAVENOUS
  Filled 2011-12-18: qty 2

## 2011-12-18 MED ORDER — HEPARIN SODIUM (PORCINE) 5000 UNIT/ML IJ SOLN
5000.0000 [IU] | Freq: Three times a day (TID) | INTRAMUSCULAR | Status: DC
  Administered 2011-12-19 – 2011-12-20 (×5): 5000 [IU] via SUBCUTANEOUS
  Filled 2011-12-18 (×8): qty 1

## 2011-12-18 MED ORDER — ALBUTEROL SULFATE (5 MG/ML) 0.5% IN NEBU
5.0000 mg | INHALATION_SOLUTION | Freq: Once | RESPIRATORY_TRACT | Status: AC
  Administered 2011-12-18: 5 mg via RESPIRATORY_TRACT
  Filled 2011-12-18: qty 1

## 2011-12-18 MED ORDER — SENNA 8.6 MG PO TABS
1.0000 | ORAL_TABLET | Freq: Two times a day (BID) | ORAL | Status: DC
  Administered 2011-12-20 – 2011-12-21 (×3): 8.6 mg via ORAL
  Filled 2011-12-18 (×5): qty 1

## 2011-12-18 MED ORDER — HYDROCODONE-ACETAMINOPHEN 10-325 MG PO TABS
1.0000 | ORAL_TABLET | Freq: Three times a day (TID) | ORAL | Status: DC | PRN
  Administered 2011-12-19 – 2011-12-20 (×4): 1 via ORAL
  Filled 2011-12-18 (×4): qty 1

## 2011-12-18 MED ORDER — METHYLPREDNISOLONE SODIUM SUCC 125 MG IJ SOLR
125.0000 mg | Freq: Once | INTRAMUSCULAR | Status: AC
  Administered 2011-12-18: 125 mg via INTRAVENOUS
  Filled 2011-12-18: qty 2

## 2011-12-18 MED ORDER — IRBESARTAN 75 MG PO TABS
75.0000 mg | ORAL_TABLET | Freq: Every day | ORAL | Status: DC
  Administered 2011-12-19 – 2011-12-21 (×3): 75 mg via ORAL
  Filled 2011-12-18 (×3): qty 1

## 2011-12-18 MED ORDER — SULFASALAZINE 500 MG PO TABS
500.0000 mg | ORAL_TABLET | Freq: Two times a day (BID) | ORAL | Status: DC
  Administered 2011-12-18 – 2011-12-21 (×6): 500 mg via ORAL
  Filled 2011-12-18 (×7): qty 1

## 2011-12-18 MED ORDER — TIOTROPIUM BROMIDE MONOHYDRATE 18 MCG IN CAPS
18.0000 ug | ORAL_CAPSULE | Freq: Every day | RESPIRATORY_TRACT | Status: DC
  Administered 2011-12-19 – 2011-12-21 (×3): 18 ug via RESPIRATORY_TRACT
  Filled 2011-12-18: qty 5

## 2011-12-18 MED ORDER — LEVOFLOXACIN IN D5W 500 MG/100ML IV SOLN
500.0000 mg | INTRAVENOUS | Status: DC
  Administered 2011-12-18 – 2011-12-20 (×3): 500 mg via INTRAVENOUS
  Filled 2011-12-18 (×3): qty 100

## 2011-12-18 MED ORDER — IPRATROPIUM BROMIDE 0.02 % IN SOLN
0.5000 mg | Freq: Once | RESPIRATORY_TRACT | Status: AC
  Administered 2011-12-18: 0.5 mg via RESPIRATORY_TRACT
  Filled 2011-12-18: qty 2.5

## 2011-12-18 MED ORDER — IPRATROPIUM BROMIDE 0.02 % IN SOLN
RESPIRATORY_TRACT | Status: AC
  Filled 2011-12-18: qty 2.5

## 2011-12-18 MED ORDER — AMLODIPINE BESYLATE 10 MG PO TABS
10.0000 mg | ORAL_TABLET | ORAL | Status: AC
  Administered 2011-12-19: 10 mg via ORAL
  Filled 2011-12-18: qty 1

## 2011-12-18 MED ORDER — ALUM & MAG HYDROXIDE-SIMETH 200-200-20 MG/5ML PO SUSP: 30.0000 mL | Freq: Four times a day (QID) | ORAL | Status: DC | PRN

## 2011-12-18 MED ORDER — ALBUTEROL SULFATE (5 MG/ML) 0.5% IN NEBU
2.5000 mg | INHALATION_SOLUTION | RESPIRATORY_TRACT | Status: DC
  Administered 2011-12-19 (×2): 2.5 mg via RESPIRATORY_TRACT
  Filled 2011-12-18 (×2): qty 0.5

## 2011-12-18 MED ORDER — ALBUTEROL SULFATE (5 MG/ML) 0.5% IN NEBU
5.0000 mg | INHALATION_SOLUTION | Freq: Once | RESPIRATORY_TRACT | Status: AC
  Administered 2011-12-19: 2.5 mg via RESPIRATORY_TRACT
  Filled 2011-12-18: qty 0.5

## 2011-12-18 MED ORDER — DOCUSATE SODIUM 100 MG PO CAPS
100.0000 mg | ORAL_CAPSULE | Freq: Two times a day (BID) | ORAL | Status: DC
  Administered 2011-12-19 – 2011-12-21 (×4): 100 mg via ORAL
  Filled 2011-12-18 (×7): qty 1

## 2011-12-18 MED ORDER — METHYLPREDNISOLONE SODIUM SUCC 125 MG IJ SOLR
60.0000 mg | Freq: Four times a day (QID) | INTRAMUSCULAR | Status: DC
Start: 2011-12-18 — End: 2011-12-19
  Administered 2011-12-18 – 2011-12-19 (×4): 60 mg via INTRAVENOUS
  Filled 2011-12-18 (×5): qty 0.96
  Filled 2011-12-18: qty 2

## 2011-12-18 MED ORDER — SODIUM CHLORIDE 0.9 % IV SOLN
INTRAVENOUS | Status: DC
  Administered 2011-12-18 – 2011-12-20 (×3): via INTRAVENOUS

## 2011-12-18 MED ORDER — ALBUTEROL SULFATE (5 MG/ML) 0.5% IN NEBU
INHALATION_SOLUTION | RESPIRATORY_TRACT | Status: AC
  Filled 2011-12-18: qty 3

## 2011-12-18 MED ORDER — ONDANSETRON HCL 4 MG/2ML IJ SOLN: 4.0000 mg | Freq: Four times a day (QID) | INTRAMUSCULAR | Status: DC | PRN

## 2011-12-18 MED ORDER — PANTOPRAZOLE SODIUM 40 MG PO TBEC
40.0000 mg | DELAYED_RELEASE_TABLET | Freq: Two times a day (BID) | ORAL | Status: DC
  Administered 2011-12-19 – 2011-12-21 (×5): 40 mg via ORAL
  Filled 2011-12-18 (×8): qty 1

## 2011-12-18 MED ORDER — FLUTICASONE PROPIONATE 50 MCG/ACT NA SUSP
2.0000 | Freq: Every day | NASAL | Status: DC
  Administered 2011-12-19 – 2011-12-20 (×2): 2 via NASAL
  Filled 2011-12-18 (×2): qty 16

## 2011-12-18 MED ORDER — MONTELUKAST SODIUM 10 MG PO TABS
10.0000 mg | ORAL_TABLET | Freq: Every day | ORAL | Status: DC
  Filled 2011-12-18: qty 1

## 2011-12-18 MED ORDER — ONDANSETRON HCL 4 MG PO TABS: 4.0000 mg | ORAL_TABLET | Freq: Four times a day (QID) | ORAL | Status: DC | PRN

## 2011-12-18 NOTE — ED Provider Notes (Addendum)
History     CSN: [id number]  Arrival date & time 12/18/11  1721   First MD Initiated Contact with Patient 12/18/11 1747      Chief Complaint  Patient presents with  . Shortness of Breath    (Consider location/radiation/quality/duration/timing/severity/associated sxs/prior treatment) Patient is a 51 y.o. male presenting with shortness of breath. The history is provided by the patient.  Shortness of Breath  The current episode started today. The problem occurs continuously. The problem has been gradually worsening. The problem is severe. Associated symptoms include cough, shortness of breath and wheezing. Pertinent negatives include no chest pain and no chest pressure. He was not exposed to toxic fumes. Smoke Inhalation: pt lives with a cigarette smoker. He is currently using steroids. He has had no prior hospitalizations. He has had no prior ICU admissions. His past medical history is significant for asthma.  Pt was not able to pick up his medications.   He has not had his singulair or spiriva for a couple of days.  He needs to pick them up at the pharmacy.  Pt has been to the ED before and states that usually when he gets steroids it helps.  The symptoms worsen when he tries to walk.  Albuterol generally helps.  He was brought in by EMS and the albuterol treatment has helped today.  Past Medical History  Diagnosis Date  . Hypertension   . Arthritis   . COPD (chronic obstructive pulmonary disease)   . Asthma     Past Surgical History  Procedure Date  . Replacement total knee     No family history on file.  History  Substance Use Topics  . Smoking status: Former Games developer  . Smokeless tobacco: Not on file  . Alcohol Use: Yes      Review of Systems  Respiratory: Positive for cough, shortness of breath and wheezing.   Cardiovascular: Negative for chest pain.  All other systems reviewed and are negative.    Allergies  Review of patient's allergies indicates no known  allergies.  Home Medications   Current Outpatient Rx  Name Route Sig Dispense Refill  . ALBUTEROL SULFATE HFA 108 (90 BASE) MCG/ACT IN AERS Inhalation Inhale 2 puffs into the lungs every 6 (six) hours as needed. For shortness of breath and cough     . ALBUTEROL SULFATE (5 MG/ML) 0.5% IN NEBU Nebulization Take 2.5 mg by nebulization every 6 (six) hours as needed. For shortness of breath    . FLUTICASONE PROPIONATE 50 MCG/ACT NA SUSP Nasal Place 2 sprays into the nose at bedtime.      Marland Kitchen FLUTICASONE-SALMETEROL 500-50 MCG/DOSE IN AEPB Inhalation Inhale 1 puff into the lungs every 12 (twelve) hours.      Marland Kitchen HYDROCODONE-ACETAMINOPHEN 10-325 MG PO TABS Oral Take 1 tablet by mouth every 8 (eight) hours as needed. For pain     . MONTELUKAST SODIUM 10 MG PO TABS Oral Take 10 mg by mouth at bedtime.      Marland Kitchen PREDNISONE 5 MG PO TABS Oral Take 5 mg by mouth daily.      . SULFASALAZINE 500 MG PO TABS Oral Take 500 mg by mouth 2 (two) times daily.    Marland Kitchen TIOTROPIUM BROMIDE MONOHYDRATE 18 MCG IN CAPS Inhalation Place 18 mcg into inhaler and inhale daily.    Marland Kitchen VALSARTAN 160 MG PO TABS Oral Take 160 mg by mouth daily.      BP 151/106  Pulse 119  Resp 34  SpO2 99%  Physical Exam  Nursing note and vitals reviewed. Constitutional: He appears well-developed and well-nourished. No distress.  HENT:  Head: Normocephalic and atraumatic.  Right Ear: External ear normal.  Left Ear: External ear normal.  Eyes: Conjunctivae are normal. Right eye exhibits no discharge. Left eye exhibits no discharge. No scleral icterus.  Neck: Neck supple. No tracheal deviation present.  Cardiovascular: Normal rate, regular rhythm and intact distal pulses.   Pulmonary/Chest: Effort normal. No stridor. No respiratory distress. He has decreased breath sounds in the left lower field. He has wheezes. He has no rales.  Abdominal: Soft. Bowel sounds are normal. He exhibits no distension. There is no tenderness. There is no rebound and no  guarding.  Musculoskeletal: He exhibits no edema and no tenderness.  Neurological: He is alert. He has normal strength. No sensory deficit. Cranial nerve deficit:  no gross defecits noted. He exhibits normal muscle tone. He displays no seizure activity. Coordination normal.  Skin: Skin is warm and dry. No rash noted.  Psychiatric: He has a normal mood and affect.    ED Course  Procedures (including critical care time)  Medications  ipratropium (ATROVENT) 0.02 % nebulizer solution (   Not Given 12/18/11 1925)  albuterol (PROVENTIL) (5 MG/ML) 0.5% nebulizer solution (   Not Given 12/18/11 1926)  0.9 %  sodium chloride infusion (  Intravenous New Bag/Given 12/18/11 1821)  albuterol (PROVENTIL) (5 MG/ML) 0.5% nebulizer solution 5 mg (not administered)  methylPREDNISolone sodium succinate (SOLU-MEDROL) 125 mg/2 mL injection 125 mg (125 mg Intravenous Given 12/18/11 1820)  albuterol (PROVENTIL) (5 MG/ML) 0.5% nebulizer solution 5 mg (5 mg Nebulization Given 12/18/11 1935)  ipratropium (ATROVENT) nebulizer solution 0.5 mg (0.5 mg Nebulization Given 12/18/11 1934)    Rate: 120  Rhythm: sinus tachycardia  QRS Axis: normal  Intervals: normal  ST/T Wave abnormalities: normal  Conduction Disutrbances:none  Narrative Interpretation: probable lvh with secondary repol abnormality  Old EKG Reviewed: none available  Labs Reviewed  CBC - Abnormal; Notable for the following:    WBC 11.5 (*)    All other components within normal limits  BASIC METABOLIC PANEL - Abnormal; Notable for the following:    Glucose, Bld 118 (*)    All other components within normal limits  POCT I-STAT TROPONIN I   Dg Chest Port 1 View  12/18/2011  *RADIOLOGY REPORT*  Clinical Data: Shortness of breath.  PORTABLE CHEST - 1 VIEW  Comparison: Chest x-ray 06/09/2011.  Findings: Lungs appear hyperexpanded with flattening of the hemidiaphragms, increased retrosternal air space and pruning of the pulmonary vasculature in the periphery,  suggestive of underlying COPD.  Advanced bilateral upper lobes scarring (left greater than right) with extensive architectural distortion is again noted. Coarsened interstitial markings are seen throughout the lungs bilaterally.  No definite acute consolidative air space disease is noted.  No definite pleural effusions.  Chronic blunting of the costophrenic sulci as compatible with scarring (unchanged).  No definite evidence of pulmonary edema.  Heart size is normal.  IMPRESSION: 1.  Overall, the appearance of the chest is similar to prior examinations, as detailed above.  No definite radiographic evidence of acute cardiopulmonary disease.  Original Report Authenticated By: Florencia Reasons, M.D.      MDM  Patient was put on the respiratory protocol. He received a total of 15 mg of albuterol, 0.5 mg Atrovent and 125 mg of Solu-Medrol. He continues to feel short of breath despite treatment in the emergency department.  The patient be admitted to the  hospital for further treatment. At this time he appears stable and is in no severe distress.        Celene Kras, MD 12/18/11 2123  Celene Kras, MD 12/19/11 [phone]

## 2011-12-18 NOTE — H&P (Signed)
[facility]                                                                                     Patient Demographics  [name], is a 51 y.o. male  CSN: [id number]  MRN: [id number]  DOB - [birth year]  Admit Date - 12/18/2011  Outpatient Primary MD for the patient is Dow Adolph, MD, MD   With History of -  Past Medical History  Diagnosis Date  . Hypertension   . Arthritis   . COPD (chronic obstructive pulmonary disease)   . Asthma       Past Surgical History  Procedure Date  . Replacement total knee     in for   Chief Complaint  Patient presents with  . Shortness of Breath     HPI  [name]  is a 51 y.o. male, with with history of asthma and COPD, patient reports he was admitted multiple times for asthma exacerbation, patient reports he quit smoking last months, patient reports he ran out of his nebulizer medications his Singulair , Spiriva, so he has been feeling shortness of breath for the last 24 hours as it has been complaining of cough with mild productive yellow sputum,in ED patient received 125 of Solu-Medrol and multiple nebulizer treatment but but he is still wheezing and complains of shortness , as well he is still tachycardic in the 120s, so hospitalist service we are requested to admit for further management of asthma exacerbation. Review of Systems    In addition to the HPI above, No Fever-chills, No Headache, No changes with Vision or hearing, No problems swallowing food or Liquids, No Chest pain, complaints of , Cough and Shortness of Breath, with yellow sputum. No Abdominal pain, No Nausea or Vommitting, Bowel movements are regular, No Blood in stool or Urine, No dysuria, No new skin rashes or bruises, No new joints pains-aches,  No new weakness, tingling, numbness in any extremity, No recent weight gain or loss, No polyuria, polydypsia or polyphagia, No significant Mental Stressors.  A full 10 point  Review of Systems was done, except as stated above, all other Review of Systems were negative.   Social History History  Substance Use Topics  . Smoking status: Former Games developer  . Smokeless tobacco: Not on file  . Alcohol Use: Yes     Family History No family history on file.   Prior to Admission medications   Medication Sig Start Date End Date Taking? Authorizing Provider  albuterol (PROVENTIL HFA;VENTOLIN HFA) 108 (90 BASE) MCG/ACT inhaler Inhale 2 puffs into the lungs every 6 (six) hours as needed. For shortness of breath and cough    Yes Historical Provider, MD  albuterol (PROVENTIL) (5 MG/ML) 0.5% nebulizer solution Take 2.5 mg by nebulization every 6 (six) hours as needed. For shortness of breath 06/09/11 06/08/12 Yes Grant Fontana, PA  fluticasone [facility]) 50 MCG/ACT nasal spray Place 2 sprays into the nose at bedtime.     Yes Historical Provider, MD  Fluticasone-Salmeterol (ADVAIR) 500-50 MCG/DOSE AEPB Inhale 1 puff into the lungs every 12 (twelve) hours.     Yes Historical Provider, MD  HYDROcodone-acetaminophen (NORCO) 10-325 MG per  tablet Take 1 tablet by mouth every 8 (eight) hours as needed. For pain    Yes Historical Provider, MD  montelukast (SINGULAIR) 10 MG tablet Take 10 mg by mouth at bedtime.     Yes Historical Provider, MD  predniSONE (DELTASONE) 5 MG tablet Take 5 mg by mouth daily.     Yes Historical Provider, MD  sulfaSALAzine (AZULFIDINE) 500 MG tablet Take 500 mg by mouth 2 (two) times daily.   Yes Historical Provider, MD  tiotropium (SPIRIVA) 18 MCG inhalation capsule Place 18 mcg into inhaler and inhale daily. 06/09/11 06/08/12 Yes Grant Fontana, PA  valsartan (DIOVAN) 160 MG tablet Take 160 mg by mouth daily.   Yes Historical Provider, MD    No Known Allergies  Physical Exam  Vitals  Blood pressure 151/94, pulse 113, resp. rate 25, SpO2 97.00%.   1. General well-nourished male lying in bed in NAD,    2. Normal affect and insight, Not  Suicidal or Homicidal, Awake Alert, Oriented *3.  3. No F.N deficits, ALL C.Nerves Intact, Strength 5/5 all 4 extremities, Sensation intact all 4 extremities, Plantars down going.  4. Ears and Eyes appear Normal, Conjunctivae clear, PERRLA. Moist Oral Mucosa.  5. Supple Neck, No JVD, No cervical lymphadenopathy appriciated, No Carotid Bruits.  6. Symmetrical Chest wall movement, decreased air movement bilaterally, significant wheezing bilaterally  7. RRR, No Gallops, Rubs or Murmurs, No Parasternal Heave.  8. Positive Bowel Sounds, Abdomen Soft, Non tender, No organomegaly appriciated,       No rebound -guarding or rigidity.  9.  No Cyanosis, Normal Skin Turgor, No Skin Rash or Bruise.  10. Good muscle tone,  joints appear normal , no effusions, Normal ROM.  11. No Palpable Lymph Nodes in Neck or Axillae    Data Review  CBC  Lab 12/18/11 1816  WBC 11.5*  HGB 14.9  HCT 43.8  PLT 291  MCV 88.8  MCH 30.2  MCHC 34.0  RDW 14.7  LYMPHSABS --  MONOABS --  EOSABS --  BASOSABS --  BANDABS --   ------------------------------------------------------------------------------------------------------------------  Chemistries   Lab 12/18/11 1816  NA 136  K 4.0  CL 103  CO2 21  GLUCOSE 118*  BUN 6  CREATININE 0.75  CALCIUM 9.2  MG --  AST --  ALT --  ALKPHOS --  BILITOT --    ---------------------------------------------------------------------------------------------------------------  Urinalysis    Component Value Date/Time   COLORURINE YELLOW 07/27/2010 1057   APPEARANCEUR CLEAR 07/27/2010 1057   LABSPEC 1.018 07/27/2010 1057   PHURINE 6.5 07/27/2010 1057   HGBUR NEGATIVE 07/27/2010 1057   BILIRUBINUR NEGATIVE 07/27/2010 1057   KETONESUR NEGATIVE 07/27/2010 1057   PROTEINUR NEGATIVE 07/27/2010 1057   UROBILINOGEN 0.2 07/27/2010 1057   NITRITE NEGATIVE 07/27/2010 1057   LEUKOCYTESUR NEGATIVE MICROSCOPIC NOT DONE ON URINES WITH NEGATIVE PROTEIN, BLOOD, LEUKOCYTES,  NITRITE, OR GLUCOSE <1000 mg/dL. 07/27/2010 1057    ----------------------------------------------------------------------------------------------------------------  Imaging results:   Dg Chest Port 1 View  12/18/2011  *RADIOLOGY REPORT*  Clinical Data: Shortness of breath.  PORTABLE CHEST - 1 VIEW  Comparison: Chest x-ray 06/09/2011.  Findings: Lungs appear hyperexpanded with flattening of the hemidiaphragms, increased retrosternal air space and pruning of the pulmonary vasculature in the periphery, suggestive of underlying COPD.  Advanced bilateral upper lobes scarring (left greater than right) with extensive architectural distortion is again noted. Coarsened interstitial markings are seen throughout the lungs bilaterally.  No definite acute consolidative air space disease is noted.  No definite pleural effusions.  Chronic blunting of the costophrenic sulci as compatible with scarring (unchanged).  No definite evidence of pulmonary edema.  Heart size is normal.  IMPRESSION: 1.  Overall, the appearance of the chest is similar to prior examinations, as detailed above.  No definite radiographic evidence of acute cardiopulmonary disease.  Original Report Authenticated By: Florencia Reasons, M.D.     Assessment & Plan  Principal Problem:  *Asthma exacerbation    1. asthma exacerbation. We'll start patient on IV filament to 60 mg every 6 hours, and will have him on Protonix for GI prophylaxis, will start him on DuoNeb every 4 hours scheduled dose and every 2 hours as needed, we'll resume him back on his Singulair , Spiriva and Advair. Will check daily peak flow. And will resume his by mouth prednisone 5 mg daily prior to discharge as patient been chronically on prednisone for last year.  2. Hypertension. Will resume patient on his home medication.   DVT Prophylaxis Heparin -   AM Labs Ordered, also please review Full Orders  Admission, patients condition and plan of care including tests being  ordered have been discussed with the patient who indicate understanding and agree with the plan and Code Status.  Code Status full  Condition GUARDED  Randol Kern, Laiklynn Raczynski M.D on 12/18/2011 at 10:55 PM    [facility] Office  [phone]

## 2011-12-18 NOTE — ED Notes (Signed)
Per EMS, pt from home, reports SOB, pt has hx of COPD.  Pt is A&O x4.  Pt received a total of 15mg  of albuterol, 0.5mg  atrovent, and 125mg  solu-medrol en route.

## 2011-12-18 NOTE — ED Notes (Signed)
ZOX:[id number]<AV> Expected date:12/18/11<BR> Expected time:<BR> Means of arrival:<BR> Comments:<BR> EMS 80 GC - asthma

## 2011-12-18 NOTE — ED Notes (Signed)
Report given to Monica,RN on 5E. 

## 2011-12-19 ENCOUNTER — Encounter (HOSPITAL_COMMUNITY): Payer: Self-pay

## 2011-12-19 DIAGNOSIS — J45901 Unspecified asthma with (acute) exacerbation: Secondary | ICD-10-CM

## 2011-12-19 DIAGNOSIS — I10 Essential (primary) hypertension: Secondary | ICD-10-CM

## 2011-12-19 LAB — CBC
Platelets: 254 10*3/uL (ref 150–400)
RBC: 4.85 MIL/uL (ref 4.22–5.81)
WBC: 7.6 10*3/uL (ref 4.0–10.5)

## 2011-12-19 LAB — BASIC METABOLIC PANEL
Calcium: 9.7 mg/dL (ref 8.4–10.5)
GFR calc non Af Amer: >90 mL/min (ref >90)
Sodium: 135 mEq/L (ref 135–145)

## 2011-12-19 MED ORDER — AMLODIPINE BESYLATE 5 MG PO TABS
5.0000 mg | ORAL_TABLET | Freq: Every day | ORAL | Status: DC
  Administered 2011-12-19 – 2011-12-20 (×2): 5 mg via ORAL
  Filled 2011-12-19 (×3): qty 1

## 2011-12-19 MED ORDER — LEVALBUTEROL HCL 0.63 MG/3ML IN NEBU
0.6300 mg | INHALATION_SOLUTION | RESPIRATORY_TRACT | Status: DC
  Administered 2011-12-19 – 2011-12-21 (×13): 0.63 mg via RESPIRATORY_TRACT
  Filled 2011-12-19 (×20): qty 3

## 2011-12-19 MED ORDER — LEVALBUTEROL HCL 0.63 MG/3ML IN NEBU
0.6300 mg | INHALATION_SOLUTION | RESPIRATORY_TRACT | Status: DC | PRN
  Filled 2011-12-19: qty 3

## 2011-12-19 MED ORDER — GUAIFENESIN-DM 100-10 MG/5ML PO SYRP
5.0000 mL | ORAL_SOLUTION | Freq: Four times a day (QID) | ORAL | Status: DC | PRN
  Administered 2011-12-19 – 2011-12-20 (×4): 5 mL via ORAL
  Filled 2011-12-19 (×4): qty 10

## 2011-12-19 MED ORDER — LABETALOL HCL 5 MG/ML IV SOLN
5.0000 mg | Freq: Once | INTRAVENOUS | Status: AC
  Administered 2011-12-19: 5 mg via INTRAVENOUS
  Filled 2011-12-19: qty 4

## 2011-12-19 MED ORDER — MONTELUKAST SODIUM 10 MG PO TABS
10.0000 mg | ORAL_TABLET | Freq: Every day | ORAL | Status: DC
  Administered 2011-12-20 – 2011-12-21 (×2): 10 mg via ORAL
  Filled 2011-12-19 (×4): qty 1

## 2011-12-19 MED ORDER — HYDRALAZINE HCL 20 MG/ML IJ SOLN
5.0000 mg | INTRAMUSCULAR | Status: AC
  Administered 2011-12-19: 5 mg via INTRAVENOUS
  Filled 2011-12-19: qty 1

## 2011-12-19 MED ORDER — HYDRALAZINE HCL 20 MG/ML IJ SOLN
10.0000 mg | Freq: Four times a day (QID) | INTRAMUSCULAR | Status: DC | PRN
  Administered 2011-12-19 – 2011-12-21 (×3): 10 mg via INTRAVENOUS
  Filled 2011-12-19 (×3): qty 1

## 2011-12-19 MED ORDER — METHYLPREDNISOLONE SODIUM SUCC 125 MG IJ SOLR
60.0000 mg | Freq: Three times a day (TID) | INTRAMUSCULAR | Status: DC
  Administered 2011-12-20 – 2011-12-21 (×4): 60 mg via INTRAVENOUS
  Filled 2011-12-19 (×7): qty 0.96

## 2011-12-19 NOTE — Progress Notes (Signed)
Patient expressed concerns regarding needing additional medication for his BP because it had been elevated and treated during the night.  His BP prior to this conversation was 151/93.  We reviewed his medications, the parameters of receiving apresoline IV which is for SBP > 175.  He verbalized understanding.  We discussed the use of SCD's but he declines to have these applied because he is receiving Heparin SQ also.  Adjusted the temperature in his room and Gennaro Africa, RN, Director of unit in to speak with him regarding his concerns about his nursing care.

## 2011-12-19 NOTE — Progress Notes (Addendum)
On call made aware that pts blood pressure is still 170/110 after receiving Hydralazine 5mg  IV. Will continue to monitor pt's blood pressure.

## 2011-12-19 NOTE — Progress Notes (Signed)
On call made aware that pt's admission strip showed tachycardia with ST depression. 12 Lead EKG done and placed on pts chart. Will continue to monitor pt.

## 2011-12-19 NOTE — Progress Notes (Signed)
On call made aware of pt's blood pressure still at 171/110 after receiving a total to 10mg  IV Hydralazine. Pt  c/o SOB and is asking to get atrovent or spiriva with his albuterol treatments. Pt also states that he takes his singular in the am along with treatments. Awaiting on call back from the on call.

## 2011-12-19 NOTE — Progress Notes (Signed)
Subjective: Patient mentions that he feels better today.  He mentions that he is not at baseline currently.  He is most concerned about his blood pressure today.  Denies any HA's, blurred vision, nausea, emesis.  Objective: Filed Vitals:   12/19/11 0531 12/19/11 0651 12/19/11 0817 12/19/11 1334  BP: 151/96 135/82  151/93  Pulse: 106   98  Temp: 99 F (37.2 C)   97.4 F (36.3 C)  TempSrc: Oral   Oral  Resp: 22   20  Height:      Weight:      SpO2:   96% 96%   Weight change:   Intake/Output Summary (Last 24 hours) at 12/19/11 1839 Last data filed at 12/19/11 1602  Gross per 24 hour  Intake 2335.83 ml  Output   3900 ml  Net -1564.17 ml    General: Alert, awake, oriented x3, in no acute distress.  HEENT: No bruits, no goiter.  Heart: Regular rate and rhythm, without murmurs, rubs, gallops.  Lungs: prolonged expiratory phase, expiratory wheezes diffusely, bilateral air movement.  Abdomen: Soft, nontender, nondistended, positive bowel sounds.  Neuro: Grossly intact, nonfocal.   Lab Results:  Basename 12/19/11 0450 12/18/11 1816  NA 135 136  K 3.7 4.0  CL 101 103  CO2 18* 21  GLUCOSE 158* 118*  BUN 7 6  CREATININE 0.71 0.75  CALCIUM 9.7 9.2  MG -- --  PHOS -- --   No results found for this basename: AST:2,ALT:2,ALKPHOS:2,BILITOT:2,PROT:2,ALBUMIN:2 in the last 72 hours No results found for this basename: LIPASE:2,AMYLASE:2 in the last 72 hours  Basename 12/19/11 0450 12/18/11 1816  WBC 7.6 11.5*  NEUTROABS -- --  HGB 14.9 14.9  HCT 43.2 43.8  MCV 89.1 88.8  PLT 254 291   No results found for this basename: CKTOTAL:3,CKMB:3,CKMBINDEX:3,TROPONINI:3 in the last 72 hours No components found with this basename: POCBNP:3 No results found for this basename: DDIMER:2 in the last 72 hours No results found for this basename: HGBA1C:2 in the last 72 hours No results found for this basename: CHOL:2,HDL:2,LDLCALC:2,TRIG:2,CHOLHDL:2,LDLDIRECT:2 in the last 72 hours No  results found for this basename: TSH,T4TOTAL,FREET3,T3FREE,THYROIDAB in the last 72 hours No results found for this basename: VITAMINB12:2,FOLATE:2,FERRITIN:2,TIBC:2,IRON:2,RETICCTPCT:2 in the last 72 hours  Micro Results: No results found for this or any previous visit (from the past 240 hour(s)).  Studies/Results: Dg Chest Port 1 View  12/18/2011  *RADIOLOGY REPORT*  Clinical Data: Shortness of breath.  PORTABLE CHEST - 1 VIEW  Comparison: Chest x-ray 06/09/2011.  Findings: Lungs appear hyperexpanded with flattening of the hemidiaphragms, increased retrosternal air space and pruning of the pulmonary vasculature in the periphery, suggestive of underlying COPD.  Advanced bilateral upper lobes scarring (left greater than right) with extensive architectural distortion is again noted. Coarsened interstitial markings are seen throughout the lungs bilaterally.  No definite acute consolidative air space disease is noted.  No definite pleural effusions.  Chronic blunting of the costophrenic sulci as compatible with scarring (unchanged).  No definite evidence of pulmonary edema.  Heart size is normal.  IMPRESSION: 1.  Overall, the appearance of the chest is similar to prior examinations, as detailed above.  No definite radiographic evidence of acute cardiopulmonary disease.  Original Report Authenticated By: Florencia Reasons, M.D.    Medications: I have reviewed the patient's current medications.   Patient Active Hospital Problem List: Asthma exacerbation (12/18/2011) -Will plan on continuing current regimen as patient is currently improved on this.   -Decrease solumedrol today -Continue home regimen -  Monitor closely  HTN: At this point not well controlled.  Will add norvasc today and reassess tomorrow.  Disposition: Pending clinical improvement.      LOS: 1 day   Penny Pia M.D.  [facility] 12/19/2011, 6:39 PM

## 2011-12-20 DIAGNOSIS — I10 Essential (primary) hypertension: Secondary | ICD-10-CM

## 2011-12-20 DIAGNOSIS — J45901 Unspecified asthma with (acute) exacerbation: Secondary | ICD-10-CM

## 2011-12-20 MED ORDER — HYDROCODONE-ACETAMINOPHEN 10-325 MG PO TABS
1.0000 | ORAL_TABLET | Freq: Four times a day (QID) | ORAL | Status: DC | PRN
  Administered 2011-12-20 – 2011-12-21 (×3): 1 via ORAL
  Filled 2011-12-20 (×3): qty 1

## 2011-12-20 MED ORDER — ZOLPIDEM TARTRATE 5 MG PO TABS
5.0000 mg | ORAL_TABLET | Freq: Every day | ORAL | Status: AC
  Administered 2011-12-20: 5 mg via ORAL
  Filled 2011-12-20: qty 1

## 2011-12-20 MED ORDER — ENOXAPARIN SODIUM 40 MG/0.4ML ~~LOC~~ SOLN
40.0000 mg | SUBCUTANEOUS | Status: DC
  Administered 2011-12-20: 40 mg via SUBCUTANEOUS
  Filled 2011-12-20 (×2): qty 0.4

## 2011-12-20 NOTE — Progress Notes (Signed)
Subjective: Patient seen and examined . still has wheeze and some SOB  Objective:  Vital signs in last 24 hours:  Filed Vitals:   12/20/11 0354 12/20/11 0600 12/20/11 0658 12/20/11 0839  BP:  157/97 145/83   Pulse:  82    Temp:  97.7 F (36.5 C)    TempSrc:  Oral    Resp:  20    Height:      Weight:      SpO2: 100% 96%  97%    Intake/Output from previous day:   Intake/Output Summary (Last 24 hours) at 12/20/11 1310 Last data filed at 12/20/11 1230  Gross per 24 hour  Intake    480 ml  Output   2100 ml  Net  -1620 ml    Physical Exam:  General:Middle aged male  in no acute distress. HEENT: no pallor, no icterus, moist oral mucosa, no JVD, no lymphadenopathy Heart: Normal  s1 &s2  Regular rate and rhythm, without murmurs, rubs, gallops. Lungs: scattered wheezing bilaterally Abdomen: Soft, nontender, nondistended, positive bowel sounds. Extremities: No clubbing cyanosis or edema with positive pedal pulses. Neuro: Alert, awake, oriented x3, nonfocal.   Lab Results:  Basic Metabolic Panel:    Component Value Date/Time   NA 135 12/19/2011 0450   K 3.7 12/19/2011 0450   CL 101 12/19/2011 0450   CO2 18* 12/19/2011 0450   BUN 7 12/19/2011 0450   CREATININE 0.71 12/19/2011 0450   GLUCOSE 158* 12/19/2011 0450   CALCIUM 9.7 12/19/2011 0450   CBC:    Component Value Date/Time   WBC 7.6 12/19/2011 0450   HGB 14.9 12/19/2011 0450   HCT 43.2 12/19/2011 0450   PLT 254 12/19/2011 0450   MCV 89.1 12/19/2011 0450   NEUTROABS 3.8 07/27/2010 1530   LYMPHSABS 1.6 07/27/2010 1530   MONOABS 1.2* 07/27/2010 1530   EOSABS 0.7 07/27/2010 1530   BASOSABS 0.1 07/27/2010 1530    No results found for this or any previous visit (from the past 240 hour(s)).  Studies/Results: Dg Chest Port 1 View  12/18/2011  *RADIOLOGY REPORT*  Clinical Data: Shortness of breath.  PORTABLE CHEST - 1 VIEW  Comparison: Chest x-ray 06/09/2011.  Findings: Lungs appear hyperexpanded with flattening of the hemidiaphragms,  increased retrosternal air space and pruning of the pulmonary vasculature in the periphery, suggestive of underlying COPD.  Advanced bilateral upper lobes scarring (left greater than right) with extensive architectural distortion is again noted. Coarsened interstitial markings are seen throughout the lungs bilaterally.  No definite acute consolidative air space disease is noted.  No definite pleural effusions.  Chronic blunting of the costophrenic sulci as compatible with scarring (unchanged).  No definite evidence of pulmonary edema.  Heart size is normal.  IMPRESSION: 1.  Overall, the appearance of the chest is similar to prior examinations, as detailed above.  No definite radiographic evidence of acute cardiopulmonary disease.  Original Report Authenticated By: Florencia Reasons, M.D.    Medications: Scheduled Meds:   . amLODipine  5 mg Oral Daily  . docusate sodium  100 mg Oral BID  . fluticasone  2 spray Each Nare QHS  . Fluticasone-Salmeterol  1 puff Inhalation Q12H  . heparin subcutaneous  5,000 Units Subcutaneous Q8H  . irbesartan  75 mg Oral Daily  . levalbuterol  0.63 mg Nebulization Q4H  . levofloxacin (LEVAQUIN) IV  500 mg Intravenous Q24H  . methylPREDNISolone (SOLU-MEDROL) injection  60 mg Intravenous Q8H  . montelukast  10 mg Oral Daily  .  pantoprazole  40 mg Oral BID AC  . senna  1 tablet Oral BID  . sulfaSALAzine  500 mg Oral BID  . tiotropium  18 mcg Inhalation Daily  . DISCONTD: methylPREDNISolone (SOLU-MEDROL) injection  60 mg Intravenous Q6H   Continuous Infusions:   . DISCONTD: sodium chloride 125 mL/hr at 12/20/11 1006   PRN Meds:.alum & mag hydroxide-simeth, guaiFENesin-dextromethorphan, hydrALAZINE, HYDROcodone-acetaminophen, levalbuterol, ondansetron (ZOFRAN) IV, ondansetron  Assessment/Plan:  Acute Asthma exacerbation  -continue IV solumedrol -Continue prn nebs -cont levaquin -cont singulair -cont PPI  HTN Improving with norvasc added on 6/4  cont  ARB  Continue remaining home meds  If continues to improve can be dced home tomorrow on po prednisone   DVT prophylaxis Sq lovenox  Diet: low sodium      LOS: 2 days   Ifrah Vest 12/20/2011, 1:10 PM

## 2011-12-21 DIAGNOSIS — I10 Essential (primary) hypertension: Secondary | ICD-10-CM

## 2011-12-21 DIAGNOSIS — J45901 Unspecified asthma with (acute) exacerbation: Secondary | ICD-10-CM

## 2011-12-21 MED ORDER — LEVOFLOXACIN 500 MG PO TABS
500.0000 mg | ORAL_TABLET | ORAL | Status: DC
  Filled 2011-12-21: qty 1

## 2011-12-21 MED ORDER — PREDNISONE 20 MG PO TABS
40.0000 mg | ORAL_TABLET | Freq: Every day | ORAL | Status: DC
  Filled 2011-12-21: qty 2

## 2011-12-21 MED ORDER — PREDNISONE 20 MG PO TABS: 20.0000 mg | ORAL_TABLET | Freq: Every day | ORAL | Status: DC

## 2011-12-21 MED ORDER — HYDROCODONE-ACETAMINOPHEN 10-325 MG PO TABS: 1.0000 | ORAL_TABLET | Freq: Four times a day (QID) | ORAL | Status: DC | PRN

## 2011-12-21 MED ORDER — LEVOFLOXACIN 500 MG PO TABS: 500.0000 mg | ORAL_TABLET | Freq: Every day | ORAL | Status: AC

## 2011-12-21 MED ORDER — AMLODIPINE BESYLATE 10 MG PO TABS
10.0000 mg | ORAL_TABLET | Freq: Every day | ORAL | Status: DC
  Administered 2011-12-21: 10 mg via ORAL
  Filled 2011-12-21: qty 1

## 2011-12-21 MED ORDER — VALSARTAN 160 MG PO TABS: 320.0000 mg | ORAL_TABLET | Freq: Every day | ORAL | Status: DC

## 2011-12-21 MED ORDER — HYDROCHLOROTHIAZIDE 12.5 MG PO CAPS: 12.5000 mg | ORAL_CAPSULE | Freq: Every day | ORAL | Status: DC

## 2011-12-21 MED ORDER — AMLODIPINE BESYLATE 10 MG PO TABS: 10.0000 mg | ORAL_TABLET | Freq: Every day | ORAL | Status: DC

## 2011-12-21 MED ORDER — PANTOPRAZOLE SODIUM 40 MG PO TBEC: 40.0000 mg | DELAYED_RELEASE_TABLET | Freq: Every day | ORAL | Status: DC

## 2011-12-21 NOTE — Discharge Summary (Signed)
Patient ID: [name] MRN: [id number] DOB/AGE: 1960-11-30 51 y.o.  Admit date: 12/18/2011 Discharge date: 12/21/2011  Primary Care Physician:  Dow Adolph, MD, MD  Discharge Diagnoses:     Principal Problem:  *Asthma exacerbation  Uncontrolled hypertension   Medication List  As of 12/21/2011  1:06 PM   TAKE these medications         albuterol 108 (90 BASE) MCG/ACT inhaler   Commonly known as: PROVENTIL HFA;VENTOLIN HFA   Inhale 2 puffs into the lungs every 6 (six) hours as needed. For shortness of breath and cough      albuterol (5 MG/ML) 0.5% nebulizer solution   Commonly known as: PROVENTIL   Take 2.5 mg by nebulization every 6 (six) hours as needed. For shortness of breath      amLODipine 10 MG tablet   Commonly known as: NORVASC   Take 1 tablet (10 mg total) by mouth daily.      fluticasone 50 MCG/ACT nasal spray   Commonly known as: FLONASE   Place 2 sprays into the nose at bedtime.      Fluticasone-Salmeterol 500-50 MCG/DOSE Aepb   Commonly known as: ADVAIR   Inhale 1 puff into the lungs every 12 (twelve) hours.      hydrochlorothiazide 12.5 MG capsule   Commonly known as: MICROZIDE   Take 1 capsule (12.5 mg total) by mouth daily.      HYDROcodone-acetaminophen 10-325 MG per tablet   Commonly known as: NORCO   Take 1 tablet by mouth every 6 (six) hours as needed. For pain      levofloxacin 500 MG tablet   Commonly known as: LEVAQUIN   Take 1 tablet (500 mg total) by mouth daily.      montelukast 10 MG tablet   Commonly known as: SINGULAIR   Take 10 mg by mouth at bedtime.      pantoprazole 40 MG tablet   Commonly known as: PROTONIX   Take 1 tablet (40 mg total) by mouth daily.      predniSONE 20 MG tablet   Commonly known as: DELTASONE   Take 1 tablet (20 mg total) by mouth daily.      valsartan 160 MG tablet   Commonly known as: DIOVAN   Take 2 tablets (320 mg total) by mouth daily.         ASK your doctor about these medications           sulfaSALAzine 500 MG tablet   Commonly known as: AZULFIDINE   Take 500 mg by mouth 2 (two) times daily.      tiotropium 18 MCG inhalation capsule   Commonly known as: SPIRIVA   Place 18 mcg into inhaler and inhale daily.            Disposition and Follow-up:  Home with outpatient PCP follow up in 1 week. needs BP monitored   Consults:  none  Significant Diagnostic Studies:  Dg Chest Port 1 View  12/18/2011  *RADIOLOGY REPORT*  Clinical Data: Shortness of breath.  PORTABLE CHEST - 1 VIEW  Comparison: Chest x-ray 06/09/2011.  Findings: Lungs appear hyperexpanded with flattening of the hemidiaphragms, increased retrosternal air space and pruning of the pulmonary vasculature in the periphery, suggestive of underlying COPD.  Advanced bilateral upper lobes scarring (left greater than right) with extensive architectural distortion is again noted. Coarsened interstitial markings are seen throughout the lungs bilaterally.  No definite acute consolidative air space disease is noted.  No definite pleural effusions.  Chronic blunting of the costophrenic sulci as compatible with scarring (unchanged).  No definite evidence of pulmonary edema.  Heart size is normal.  IMPRESSION: 1.  Overall, the appearance of the chest is similar to prior examinations, as detailed above.  No definite radiographic evidence of acute cardiopulmonary disease.  Original Report Authenticated By: Florencia Reasons, M.D.    Brief H and P: For complete details please refer to admission H and P, but in brief 51 y.o. male, with with history of asthma and COPD, patient reports he was admitted multiple times for asthma exacerbation, patient reports he quit smoking last months, patient reports he ran out of his nebulizer medications his Singulair , Spiriva, so he has been feeling shortness of breath for the last 24 hours as it has been complaining of cough with mild productive yellow sputum,in ED patient received 125 of Solu-Medrol  and multiple nebulizer treatment but but he is still wheezing and complains of shortness , as well he is still tachycardic in the 120s, so hospitalist service we are requested to admit for further management of asthma exacerbation.   Physical Exam on Discharge:  Filed Vitals:   12/21/11 0418 12/21/11 0600 12/21/11 1000 12/21/11 1250  BP:  159/93  168/99  Pulse:  84    Temp:  98 F (36.7 C)    TempSrc:  Oral    Resp:  20    Height:      Weight:      SpO2: 100% 96% 98%      Intake/Output Summary (Last 24 hours) at 12/21/11 1306 Last data filed at 12/21/11 1252  Gross per 24 hour  Intake    480 ml  Output    825 ml  Net   -345 ml    General: Alert, awake, oriented x3, in no acute distress. HEENT: No bruits, no goiter. Heart: Regular rate and rhythm, without murmurs, rubs, gallops. Lungs: Clear to auscultation bilaterally. ( wheezing resolved) Abdomen: Soft, nontender, nondistended, positive bowel sounds. Extremities: No clubbing cyanosis or edema with positive pedal pulses. Neuro: Grossly intact, nonfocal.  CBC:    Component Value Date/Time   WBC 7.6 12/19/2011 0450   HGB 14.9 12/19/2011 0450   HCT 43.2 12/19/2011 0450   PLT 254 12/19/2011 0450   MCV 89.1 12/19/2011 0450   NEUTROABS 3.8 07/27/2010 1530   LYMPHSABS 1.6 07/27/2010 1530   MONOABS 1.2* 07/27/2010 1530   EOSABS 0.7 07/27/2010 1530   BASOSABS 0.1 07/27/2010 1530    Basic Metabolic Panel:    Component Value Date/Time   NA 135 12/19/2011 0450   K 3.7 12/19/2011 0450   CL 101 12/19/2011 0450   CO2 18* 12/19/2011 0450   BUN 7 12/19/2011 0450   CREATININE 0.71 12/19/2011 0450   GLUCOSE 158* 12/19/2011 0450   CALCIUM 9.7 12/19/2011 0450    Hospital Course:   Acute Asthma exacerbation  -patient given IV solumedrol and prn nebs with improvement in symptoms -cont levaquin ( completes 5 days course  6/7) -cont singulair  -will discharge on po prednisone taper  -continue with home inhalers and nebs  on discharge -cont PPI while  on steroids   uncontrolled HTN  Patient recently had increase in his dose of diovan to 320 mg as outpt  noted for persistentyl elevated BP  added norvasc and dose increased to 10 mg daily  continue diovan as outpatient  i will add a low dose HCTZ as well ( 12.5 mg daily) . He should follow up  for BP check in 1 week at PCP office.   Patient clinically stable for discharge home with outpatient follow up.     Time spent on Discharge: 45 minutes  Signed: Eddie North 12/21/2011, 1:06 PM

## 2011-12-21 NOTE — Progress Notes (Signed)
Discharge instructions given to pt, verbalized understanding. Left the unit in stable condition. 

## 2011-12-21 NOTE — Discharge Instructions (Addendum)
Asthma Attack Prevention HOW CAN ASTHMA BE PREVENTED? Currently, there is no way to prevent asthma from starting. However, you can take steps to control the disease and prevent its symptoms after you have been diagnosed. Learn about your asthma and how to control it. Take an active role to control your asthma by working with your caregiver to create and follow an asthma action plan. An asthma action plan guides you in taking your medicines properly, avoiding factors that make your asthma worse, tracking your level of asthma control, responding to worsening asthma, and seeking emergency care when needed. To track your asthma, keep records of your symptoms, check your peak flow number using a peak flow meter (handheld device that shows how well air moves out of your lungs), and get regular asthma checkups.  Other ways to prevent asthma attacks include:  Use medicines as your caregiver directs.   Identify and avoid things that make your asthma worse (as much as you can).   Keep track of your asthma symptoms and level of control.   Get regular checkups for your asthma.   With your caregiver, write a detailed plan for taking medicines and managing an asthma attack. Then be sure to follow your action plan. Asthma is an ongoing condition that needs regular monitoring and treatment.   Identify and avoid asthma triggers. A number of outdoor allergens and irritants (pollen, mold, cold air, air pollution) can trigger asthma attacks. Find out what causes or makes your asthma worse, and take steps to avoid those triggers (see below).   Monitor your breathing. Learn to recognize warning signs of an attack, such as slight coughing, wheezing or shortness of breath. However, your lung function may already decrease before you notice any signs or symptoms, so regularly measure and record your peak airflow with a home peak flow meter.   Identify and treat attacks early. If you act quickly, you're less likely to have  a severe attack. You will also need less medicine to control your symptoms. When your peak flow measurements decrease and alert you to an upcoming attack, take your medicine as instructed, and immediately stop any activity that may have triggered the attack. If your symptoms do not improve, get medical help.   Pay attention to increasing quick-relief inhaler use. If you find yourself relying on your quick-relief inhaler (such as albuterol), your asthma is not under control. See your caregiver about adjusting your treatment.  IDENTIFY AND CONTROL FACTORS THAT MAKE YOUR ASTHMA WORSE A number of common things can set off or make your asthma symptoms worse (asthma triggers). Keep track of your asthma symptoms for several weeks, detailing all the environmental and emotional factors that are linked with your asthma. When you have an asthma attack, go back to your asthma diary to see which factor, or combination of factors, might have contributed to it. Once you know what these factors are, you can take steps to control many of them.  Allergies: If you have allergies and asthma, it is important to take asthma prevention steps at home. Asthma attacks (worsening of asthma symptoms) can be triggered by allergies, which can cause temporary increased inflammation of your airways. Minimizing contact with the substance to which you are allergic will help prevent an asthma attack. Animal Dander:   Some people are allergic to the flakes of skin or dried saliva from animals with fur or feathers. Keep these pets out of your home.   If you can't keep a pet outdoors, keep the   pet out of your bedroom and other sleeping areas at all times, and keep the door closed.   Remove carpets and furniture covered with cloth from your home. If that is not possible, keep the pet away from fabric-covered furniture and carpets.  Dust Mites:  Many people with asthma are allergic to dust mites. Dust mites are tiny bugs that are found in  every home, in mattresses, pillows, carpets, fabric-covered furniture, bedcovers, clothes, stuffed toys, fabric, and other fabric-covered items.   Cover your mattress in a special dust-proof cover.   Cover your pillow in a special dust-proof cover, or wash the pillow each week in hot water. Water must be hotter than 130 F to kill dust mites. Cold or warm water used with detergent and bleach can also be effective.   Wash the sheets and blankets on your bed each week in hot water.   Try not to sleep or lie on cloth-covered cushions.   Call ahead when traveling and ask for a smoke-free hotel room. Bring your own bedding and pillows, in case the hotel only supplies feather pillows and down comforters, which may contain dust mites and cause asthma symptoms.   Remove carpets from your bedroom and those laid on concrete, if you can.   Keep stuffed toys out of the bed, or wash the toys weekly in hot water or cooler water with detergent and bleach.  Cockroaches:  Many people with asthma are allergic to the droppings and remains of cockroaches.   Keep food and garbage in closed containers. Never leave food out.   Use poison baits, traps, powders, gels, or paste (for example, boric acid).   If a spray is used to kill cockroaches, stay out of the room until the odor goes away.  Indoor Mold:  Fix leaky faucets, pipes, or other sources of water that have mold around them.   Clean moldy surfaces with a cleaner that has bleach in it.  Pollen and Outdoor Mold:  When pollen or mold spore counts are high, try to keep your windows closed.   Stay indoors with windows closed from late morning to afternoon, if you can. Pollen and some mold spore counts are highest at that time.   Ask your caregiver whether you need to take or increase anti-inflammatory medicine before your allergy season starts.  Irritants:   Tobacco smoke is an irritant. If you smoke, ask your caregiver how you can quit. Ask family  members to quit smoking, too. Do not allow smoking in your home or car.   If possible, do not use a wood-burning stove, kerosene heater, or fireplace. Minimize exposure to all sources of smoke, including incense, candles, fires, and fireworks.   Try to stay away from strong odors and sprays, such as perfume, talcum powder, hair spray, and paints.   Decrease humidity in your home and use an indoor air cleaning device. Reduce indoor humidity to below 60 percent. Dehumidifiers or central air conditioners can do this.   Try to have someone else vacuum for you once or twice a week, if you can. Stay out of rooms while they are being vacuumed and for a short while afterward.   If you vacuum, use a dust mask from a hardware store, a double-layered or microfilter vacuum cleaner bag, or a vacuum cleaner with a HEPA filter.   Sulfites in foods and beverages can be irritants. Do not drink beer or wine, or eat dried fruit, processed potatoes, or shrimp if they cause asthma   symptoms.   Cold air can trigger an asthma attack. Cover your nose and mouth with a scarf on cold or windy days.   Several health conditions can make asthma more difficult to manage, including runny nose, sinus infections, reflux disease, psychological stress, and sleep apnea. Your caregiver will treat these conditions, as well.   Avoid close contact with people who have a cold or the flu, since your asthma symptoms may get worse if you catch the infection from them. Wash your hands thoroughly after touching items that may have been handled by people with a respiratory infection.   Get a flu shot every year to protect against the flu virus, which often makes asthma worse for days or weeks. Also get a pneumonia shot once every five to 10 years.  Drugs:  Aspirin and other painkillers can cause asthma attacks. 10% to 20% of people with asthma have sensitivity to aspirin or a group of painkillers called non-steroidal anti-inflammatory drugs  (NSAIDS), such as ibuprofen and naproxen. These drugs are used to treat pain and reduce fevers. Asthma attacks caused by any of these medicines can be severe and even fatal. These drugs must be avoided in people who have known aspirin sensitive asthma. Products with acetaminophen are considered safe for people who have asthma. It is important that people with aspirin sensitivity read labels of all over-the-counter drugs used to treat pain, colds, coughs, and fever.   Beta blockers and ACE inhibitors are other drugs which you should discuss with your caregiver, in relation to your asthma.  ALLERGY SKIN TESTING  Ask your asthma caregiver about allergy skin testing or blood testing (RAST test) to identify the allergens to which you are sensitive. If you are found to have allergies, allergy shots (immunotherapy) for asthma may help prevent future allergies and asthma. With allergy shots, small doses of allergens (substances to which you are allergic) are injected under your skin on a regular schedule. Over a period of time, your body may become used to the allergen and less responsive with asthma symptoms. You can also take measures to minimize your exposure to those allergens. EXERCISE  If you have exercise-induced asthma, or are planning vigorous exercise, or exercise in cold, humid, or dry environments, prevent exercise-induced asthma by following your caregiver's advice regarding asthma treatment before exercising. Document Released: 06/21/2009 Document Revised: 06/22/2011 Document Reviewed: 06/21/2009 [facility] Patient Information 2012 [place], [place].Hypertension As your heart beats, it forces blood through your arteries. This force is your blood pressure. If the pressure is too high, it is called hypertension (HTN) or high blood pressure. HTN is dangerous because you may have it and not know it. High blood pressure may mean that your heart has to work harder to pump blood. Your arteries may be narrow or  stiff. The extra work puts you at risk for heart disease, stroke, and other problems.  Blood pressure consists of two numbers, a higher number over a lower, 110/72, for example. It is stated as "110 over 72." The ideal is below 120 for the top number (systolic) and under 80 for the bottom (diastolic). Write down your blood pressure today. You should pay close attention to your blood pressure if you have certain conditions such as:  Heart failure.   Prior heart attack.   Diabetes   Chronic kidney disease.   Prior stroke.   Multiple risk factors for heart disease.  To see if you have HTN, your blood pressure should be measured while you are seated with  your arm held at the level of the heart. It should be measured at least twice. A one-time elevated blood pressure reading (especially in the Emergency Department) does not mean that you need treatment. There may be conditions in which the blood pressure is different between your right and left arms. It is important to see your caregiver soon for a recheck. Most people have essential hypertension which means that there is not a specific cause. This type of high blood pressure may be lowered by changing lifestyle factors such as:  Stress.   Smoking.   Lack of exercise.   Excessive weight.   Drug/tobacco/alcohol use.   Eating less salt.  Most people do not have symptoms from high blood pressure until it has caused damage to the body. Effective treatment can often prevent, delay or reduce that damage. TREATMENT  When a cause has been identified, treatment for high blood pressure is directed at the cause. There are a large number of medications to treat HTN. These fall into several categories, and your caregiver will help you select the medicines that are best for you. Medications may have side effects. You should review side effects with your caregiver. If your blood pressure stays high after you have made lifestyle changes or started on  medicines,   Your medication(s) may need to be changed.   Other problems may need to be addressed.   Be certain you understand your prescriptions, and know how and when to take your medicine.   Be sure to follow up with your caregiver within the time frame advised (usually within two weeks) to have your blood pressure rechecked and to review your medications.   If you are taking more than one medicine to lower your blood pressure, make sure you know how and at what times they should be taken. Taking two medicines at the same time can result in blood pressure that is too low.  SEEK IMMEDIATE MEDICAL CARE IF:  You develop a severe headache, blurred or changing vision, or confusion.   You have unusual weakness or numbness, or a faint feeling.   You have severe chest or abdominal pain, vomiting, or breathing problems.  MAKE SURE YOU:   Understand these instructions.   Will watch your condition.   Will get help right away if you are not doing well or get worse.  Document Released: 07/03/2005 Document Revised: 06/22/2011 Document Reviewed: 02/21/2008 [facility] Patient Information 2012 [place], [place].

## 2011-12-21 NOTE — Progress Notes (Signed)
PHARMACIST - PHYSICIAN COMMUNICATION DR:   Dhungel CONCERNING: Antibiotic IV to Oral Route Change Policy  RECOMMENDATION: This patient is receiving Levaquin by the intravenous route.  Based on criteria approved by the Pharmacy and Therapeutics Committee, the antibiotic(s) is/are being converted to the equivalent oral dose form(s).   DESCRIPTION: These criteria include:  Patient being treated for a respiratory tract infection, urinary tract infection, or cellulitis  The patient is not neutropenic and does not exhibit a GI malabsorption state  The patient is eating (either orally or via tube) and/or has been taking other orally administered medications for a least 24 hours  The patient is improving clinically and has a Tmax < 100.5  If you have questions about this conversion, please contact the Pharmacy Department  []   [phone] )  [facility] []   [phone] )  [facility]  []   [phone] )  [facility] [x]   [phone] )  [facility]   Thank you, Loralee Pacas, PharmD, BCPS 12/21/2011 10:01 AM

## 2011-12-21 NOTE — Progress Notes (Signed)
Pt concerned about BP during the night, stating, "last night the nurse gave me something for it, why aren't you?" BP at that time: 151/98. On-call made aware and voiced to recheck in 1 hr. BP @ 0130: 151/86, P: 82, on call made aware. Pt denied headache or other c/o. Instructed pt additional BP medications not required at this time. Pt stated, "well the nurse last night would have given it to me." Parameters for BP medication explained, understanding voiced. BP @ 0407: 150/90, pt denied c/o. Pt denied needs. Will continue to monitor.

## 2012-03-07 ENCOUNTER — Encounter (HOSPITAL_COMMUNITY): Payer: Self-pay | Admitting: Emergency Medicine

## 2012-03-07 ENCOUNTER — Emergency Department (HOSPITAL_COMMUNITY)
Admission: EM | Admit: 2012-03-07 | Discharge: 2012-03-07 | Disposition: A | Discharge status: Home / Self Care | Payer: Medicare Other | Attending: Emergency Medicine | Admitting: Emergency Medicine

## 2012-03-07 DIAGNOSIS — J449 Chronic obstructive pulmonary disease, unspecified: Secondary | ICD-10-CM | POA: Insufficient documentation

## 2012-03-07 DIAGNOSIS — Z760 Encounter for issue of repeat prescription: Secondary | ICD-10-CM | POA: Insufficient documentation

## 2012-03-07 DIAGNOSIS — I10 Essential (primary) hypertension: Secondary | ICD-10-CM | POA: Insufficient documentation

## 2012-03-07 DIAGNOSIS — J4489 Other specified chronic obstructive pulmonary disease: Secondary | ICD-10-CM | POA: Insufficient documentation

## 2012-03-07 DIAGNOSIS — Z87891 Personal history of nicotine dependence: Secondary | ICD-10-CM | POA: Insufficient documentation

## 2012-03-07 DIAGNOSIS — M129 Arthropathy, unspecified: Secondary | ICD-10-CM | POA: Insufficient documentation

## 2012-03-07 MED ORDER — AMLODIPINE BESYLATE 10 MG PO TABS: 10.0000 mg | ORAL_TABLET | Freq: Every day | ORAL | Status: DC

## 2012-03-07 MED ORDER — PREDNISONE 5 MG PO TABS: 5.0000 mg | ORAL_TABLET | Freq: Every day | ORAL | Status: DC

## 2012-03-07 MED ORDER — PANTOPRAZOLE SODIUM 40 MG PO TBEC: 40.0000 mg | DELAYED_RELEASE_TABLET | Freq: Every day | ORAL | Status: DC

## 2012-03-07 MED ORDER — ALBUTEROL SULFATE HFA 108 (90 BASE) MCG/ACT IN AERS: 2.0000 | INHALATION_SPRAY | Freq: Four times a day (QID) | RESPIRATORY_TRACT | Status: AC | PRN

## 2012-03-07 MED ORDER — HYDROCHLOROTHIAZIDE 12.5 MG PO CAPS: 12.5000 mg | ORAL_CAPSULE | Freq: Every day | ORAL | Status: DC

## 2012-03-07 MED ORDER — PREDNISONE 20 MG PO TABS
60.0000 mg | ORAL_TABLET | Freq: Once | ORAL | Status: AC
  Administered 2012-03-07: 60 mg via ORAL
  Filled 2012-03-07: qty 3

## 2012-03-07 MED ORDER — FLUTICASONE-SALMETEROL 500-50 MCG/DOSE IN AEPB: 1.0000 | INHALATION_SPRAY | Freq: Two times a day (BID) | RESPIRATORY_TRACT | Status: DC

## 2012-03-07 MED ORDER — HYDROCODONE-ACETAMINOPHEN 10-325 MG PO TABS: 1.0000 | ORAL_TABLET | Freq: Four times a day (QID) | ORAL | Status: DC | PRN

## 2012-03-07 MED ORDER — VALSARTAN 160 MG PO TABS: 320.0000 mg | ORAL_TABLET | Freq: Every day | ORAL | Status: DC

## 2012-03-07 MED ORDER — MONTELUKAST SODIUM 10 MG PO TABS: 10.0000 mg | ORAL_TABLET | Freq: Every day | ORAL | Status: AC

## 2012-03-07 MED ORDER — ALBUTEROL SULFATE (5 MG/ML) 0.5% IN NEBU
5.0000 mg | INHALATION_SOLUTION | Freq: Once | RESPIRATORY_TRACT | Status: AC
  Administered 2012-03-07: 5 mg via RESPIRATORY_TRACT
  Filled 2012-03-07: qty 1

## 2012-03-07 MED ORDER — TIOTROPIUM BROMIDE MONOHYDRATE 18 MCG IN CAPS: 18.0000 ug | ORAL_CAPSULE | Freq: Every day | RESPIRATORY_TRACT | Status: DC

## 2012-03-07 NOTE — ED Notes (Signed)
Pt presented to ED with the complaint of SOB.Pt says he ran out of his medications and inhaler.

## 2012-03-07 NOTE — ED Notes (Signed)
Pt for discharge.Vital signs stable and GCS 15 

## 2012-03-07 NOTE — ED Provider Notes (Signed)
History     CSN: [id number]  Arrival date & time 03/07/12  0814   First MD Initiated Contact with Patient 03/07/12 [phone]      Chief Complaint  Patient presents with  . Shortness of Breath    (Consider location/radiation/quality/duration/timing/severity/associated sxs/prior treatment) Patient is a 51 y.o. male presenting with shortness of breath. The history is provided by the patient.  Shortness of Breath  Associated symptoms include shortness of breath. Pertinent negatives include no chest pain.   patient's had shortness of breath the last few days. He states that he has been out of some of his medications do to [facility] closing. He states that he needs his inhaler and pain medicines. He states he feels as if his breathing would be fine as if he can get his medications. No chest pain. No cough. No fevers. No abdominal pain. This is typical shortness of breath for him.  Past Medical History  Diagnosis Date  . Hypertension   . Arthritis   . COPD (chronic obstructive pulmonary disease)   . Asthma     Past Surgical History  Procedure Date  . Replacement total knee     No family history on file.  History  Substance Use Topics  . Smoking status: Former Games developer  . Smokeless tobacco: Not on file  . Alcohol Use: Yes      Review of Systems  Constitutional: Negative for activity change and appetite change.  HENT: Negative for neck stiffness.   Eyes: Negative for pain.  Respiratory: Positive for shortness of breath. Negative for chest tightness.   Cardiovascular: Negative for chest pain and leg swelling.  Gastrointestinal: Negative for nausea, vomiting, abdominal pain and diarrhea.  Genitourinary: Negative for flank pain.  Musculoskeletal: Negative for back pain.  Skin: Negative for rash.  Neurological: Negative for weakness, numbness and headaches.  Psychiatric/Behavioral: Negative for behavioral problems.    Allergies  Review of patient's allergies indicates no  known allergies.  Home Medications   Current Outpatient Rx  Name Route Sig Dispense Refill  . ALBUTEROL SULFATE (5 MG/ML) 0.5% IN NEBU Nebulization Take 2.5 mg by nebulization every 6 (six) hours as needed. For shortness of breath    . FLUTICASONE PROPIONATE 50 MCG/ACT NA SUSP Nasal Place 2 sprays into the nose at bedtime.      Marland Kitchen PREDNISONE 20 MG PO TABS Oral Take 1 tablet (20 mg total) by mouth daily. 15 tablet 0    Take 40 mg po daily for 3 days, then 30 mg po dail ...  . ALBUTEROL SULFATE HFA 108 (90 BASE) MCG/ACT IN AERS Inhalation Inhale 2 puffs into the lungs every 6 (six) hours as needed for wheezing. For shortness of breath and cough 6.7 g 1  . AMLODIPINE BESYLATE 10 MG PO TABS Oral Take 1 tablet (10 mg total) by mouth daily. 30 tablet 0  . FLUTICASONE-SALMETEROL 500-50 MCG/DOSE IN AEPB Inhalation Inhale 1 puff into the lungs every 12 (twelve) hours. 60 each 0  . HYDROCHLOROTHIAZIDE 12.5 MG PO CAPS Oral Take 1 capsule (12.5 mg total) by mouth daily. 30 capsule 2  . HYDROCODONE-ACETAMINOPHEN 10-325 MG PO TABS Oral Take 1 tablet by mouth every 6 (six) hours as needed. For pain 10 tablet 0  . MONTELUKAST SODIUM 10 MG PO TABS Oral Take 1 tablet (10 mg total) by mouth at bedtime. 30 tablet 0  . PANTOPRAZOLE SODIUM 40 MG PO TBEC Oral Take 1 tablet (40 mg total) by mouth daily. 15 tablet 0  .  PREDNISONE 5 MG PO TABS Oral Take 1 tablet (5 mg total) by mouth daily. 15 tablet 0  . TIOTROPIUM BROMIDE MONOHYDRATE 18 MCG IN CAPS Inhalation Place 1 capsule (18 mcg total) into inhaler and inhale daily. 30 capsule 0  . VALSARTAN 160 MG PO TABS Oral Take 2 tablets (320 mg total) by mouth daily. 30 tablet 0    BP 162/106  Pulse 73  Temp 98.7 F (37.1 C)  Resp 23  SpO2 96%  Physical Exam  Nursing note and vitals reviewed. Constitutional: He is oriented to person, place, and time. He appears well-developed and well-nourished.  HENT:  Head: Normocephalic and atraumatic.  Eyes: EOM are normal.  Pupils are equal, round, and reactive to light.  Neck: Normal range of motion. Neck supple.  Cardiovascular: Normal rate, regular rhythm and normal heart sounds.   No murmur heard. Pulmonary/Chest: Effort normal and breath sounds normal.       Mildly harsh breath sounds. No wheezes. No rales  Abdominal: Soft. Bowel sounds are normal. He exhibits no distension and no mass. There is no tenderness. There is no rebound and no guarding.  Musculoskeletal: Normal range of motion. He exhibits no edema.  Neurological: He is alert and oriented to person, place, and time. No cranial nerve deficit.  Skin: Skin is warm and dry.  Psychiatric: He has a normal mood and affect.    ED Course  Procedures (including critical care time)  Labs Reviewed - No data to display No results found.   1. Medication refill     Date: 03/07/2012  Rate: 95  Rhythm: normal sinus rhythm  QRS Axis: normal  Intervals: normal  ST/T Wave abnormalities: nonspecific T wave changes  Conduction Disutrbances:none  Narrative Interpretation:   Old EKG Reviewed: unchanged     MDM  Patient presents for medication refill. He was a patient at [facility] and no longer get his medications. He states he only has some mild shortness of breath that he feels would improve with his own medications. He is given prescriptions for his medications and single dose of prednisone. He'll be discharged home.        Juliet Rude. Rubin Payor, MD 03/07/12 1039

## 2012-04-02 ENCOUNTER — Emergency Department (INDEPENDENT_AMBULATORY_CARE_PROVIDER_SITE_OTHER)
Admission: EM | Admit: 2012-04-02 | Discharge: 2012-04-02 | Disposition: A | Discharge status: Home / Self Care | Payer: Medicare Other | Source: Home / Self Care | Attending: Family Medicine | Admitting: Family Medicine

## 2012-04-02 ENCOUNTER — Encounter (HOSPITAL_COMMUNITY): Payer: Self-pay | Admitting: *Deleted

## 2012-04-02 DIAGNOSIS — G894 Chronic pain syndrome: Secondary | ICD-10-CM

## 2012-04-02 NOTE — ED Notes (Signed)
Pt reports needing vicodin 10-325 pain pills "motrin just isn't doing it". [facility] pt - no pcp

## 2012-04-02 NOTE — ED Notes (Signed)
Pt had prescription written through today for hydrocodone 10-325

## 2012-04-02 NOTE — ED Provider Notes (Signed)
History     CSN: [id number]  Arrival date & time 04/02/12  0941   First MD Initiated Contact with Patient 04/02/12 [phone]      Chief Complaint  Patient presents with  . Medication Refill  . Knee Pain    (Consider location/radiation/quality/duration/timing/severity/associated sxs/prior treatment) Patient is a 51 y.o. male presenting with knee pain. The history is provided by the patient.  Knee Pain This is a chronic problem. Episode onset: here for vicodin for pain, advised that we are not a chronic pain nclinic and unable to accommodate , will refer to pain management .    Past Medical History  Diagnosis Date  . Hypertension   . Arthritis   . COPD (chronic obstructive pulmonary disease)   . Asthma     Past Surgical History  Procedure Date  . Replacement total knee     Family History  Problem Relation Age of Onset  . Family history unknown: Yes    History  Substance Use Topics  . Smoking status: Current Some Day Smoker  . Smokeless tobacco: Not on file  . Alcohol Use: Yes      Review of Systems  Constitutional: Negative.   Musculoskeletal: Positive for joint swelling.    Allergies  Review of patient's allergies indicates no known allergies.  Home Medications   Current Outpatient Rx  Name Route Sig Dispense Refill  . ALBUTEROL SULFATE HFA 108 (90 BASE) MCG/ACT IN AERS Inhalation Inhale 2 puffs into the lungs every 6 (six) hours as needed for wheezing. For shortness of breath and cough 6.7 g 1  . ALBUTEROL SULFATE (5 MG/ML) 0.5% IN NEBU Nebulization Take 2.5 mg by nebulization every 6 (six) hours as needed. For shortness of breath    . AMLODIPINE BESYLATE 10 MG PO TABS Oral Take 1 tablet (10 mg total) by mouth daily. 30 tablet 0  . FLUTICASONE PROPIONATE 50 MCG/ACT NA SUSP Nasal Place 2 sprays into the nose at bedtime.      Marland Kitchen FLUTICASONE-SALMETEROL 500-50 MCG/DOSE IN AEPB Inhalation Inhale 1 puff into the lungs every 12 (twelve) hours. 60 each 0  .  HYDROCHLOROTHIAZIDE 12.5 MG PO CAPS Oral Take 1 capsule (12.5 mg total) by mouth daily. 30 capsule 2  . HYDROCODONE-ACETAMINOPHEN 10-325 MG PO TABS Oral Take 1 tablet by mouth every 6 (six) hours as needed. For pain 10 tablet 0  . PANTOPRAZOLE SODIUM 40 MG PO TBEC Oral Take 1 tablet (40 mg total) by mouth daily. 15 tablet 0  . PREDNISONE 20 MG PO TABS Oral Take 1 tablet (20 mg total) by mouth daily. 15 tablet 0    Take 40 mg po daily for 3 days, then 30 mg po dail ...  . TIOTROPIUM BROMIDE MONOHYDRATE 18 MCG IN CAPS Inhalation Place 1 capsule (18 mcg total) into inhaler and inhale daily. 30 capsule 0  . VALSARTAN 160 MG PO TABS Oral Take 2 tablets (320 mg total) by mouth daily. 30 tablet 0  . MONTELUKAST SODIUM 10 MG PO TABS Oral Take 1 tablet (10 mg total) by mouth at bedtime. 30 tablet 0  . PREDNISONE 5 MG PO TABS Oral Take 1 tablet (5 mg total) by mouth daily. 15 tablet 0    BP 156/63  Pulse 90  Temp 97.4 F (36.3 C) (Oral)  Resp 18  SpO2 97%  Physical Exam  Nursing note and vitals reviewed. Constitutional: He is oriented to person, place, and time. He appears well-developed and well-nourished. No distress.  Neurological: He  is alert and oriented to person, place, and time.  Skin: Skin is warm and dry.    ED Course  Procedures (including critical care time)  Labs Reviewed - No data to display No results found.   1. Chronic pain disorder       MDM          Linna Hoff, MD 04/02/12 1017

## 2012-04-24 ENCOUNTER — Encounter (HOSPITAL_COMMUNITY): Payer: Self-pay | Admitting: Emergency Medicine

## 2012-04-24 ENCOUNTER — Emergency Department (HOSPITAL_COMMUNITY)
Admission: EM | Admit: 2012-04-24 | Discharge: 2012-04-24 | Disposition: A | Discharge status: Home / Self Care | Payer: Medicare Other | Attending: Emergency Medicine | Admitting: Emergency Medicine

## 2012-04-24 DIAGNOSIS — F17200 Nicotine dependence, unspecified, uncomplicated: Secondary | ICD-10-CM | POA: Insufficient documentation

## 2012-04-24 DIAGNOSIS — J449 Chronic obstructive pulmonary disease, unspecified: Secondary | ICD-10-CM | POA: Insufficient documentation

## 2012-04-24 DIAGNOSIS — J4489 Other specified chronic obstructive pulmonary disease: Secondary | ICD-10-CM | POA: Insufficient documentation

## 2012-04-24 DIAGNOSIS — M129 Arthropathy, unspecified: Secondary | ICD-10-CM | POA: Insufficient documentation

## 2012-04-24 DIAGNOSIS — I10 Essential (primary) hypertension: Secondary | ICD-10-CM | POA: Insufficient documentation

## 2012-04-24 DIAGNOSIS — R062 Wheezing: Secondary | ICD-10-CM

## 2012-04-24 DIAGNOSIS — Z760 Encounter for issue of repeat prescription: Secondary | ICD-10-CM

## 2012-04-24 MED ORDER — PREDNISONE 5 MG PO TABS: 5.0000 mg | ORAL_TABLET | Freq: Every day | ORAL | Status: DC

## 2012-04-24 MED ORDER — PREDNISONE 20 MG PO TABS
60.0000 mg | ORAL_TABLET | Freq: Once | ORAL | Status: AC
  Administered 2012-04-24: 60 mg via ORAL
  Filled 2012-04-24: qty 3

## 2012-04-24 MED ORDER — IRBESARTAN 150 MG PO TABS
150.0000 mg | ORAL_TABLET | Freq: Every day | ORAL | Status: DC
  Administered 2012-04-24: 150 mg via ORAL
  Filled 2012-04-24: qty 1

## 2012-04-24 MED ORDER — VALSARTAN 160 MG PO TABS: 160.0000 mg | ORAL_TABLET | Freq: Every day | ORAL | Status: DC

## 2012-04-24 NOTE — ED Notes (Signed)
Provider at bedside

## 2012-04-24 NOTE — ED Notes (Signed)
Has run out of meds albuteral , vicodin and pred and diovan bp med   Ran out last wed.

## 2012-04-24 NOTE — ED Provider Notes (Signed)
History     CSN: [id number]  Arrival date & time 04/24/12  1145   First MD Initiated Contact with Patient 04/24/12 1428      No chief complaint on file.   (Consider location/radiation/quality/duration/timing/severity/associated sxs/prior treatment) HPI Comments: [name] is [name] who presents with complaint of medication refill. States he is a [facility] pt, and since it is closed, he ran out of several prescriptions. States needs refill of prednisone, usually takes 5mg  daily for sob, also Dovan for blood pressure, and vicodin. States has not had these medications in several days. Denies any complaints. States at time SOB, but still doing his inhalers at home that help. States he is working on getting a new primary care doctor. Pt states Vicodin is for right knee pain that he had replaced, and pain is chronic.    Past Medical History  Diagnosis Date  . Hypertension   . Arthritis   . COPD (chronic obstructive pulmonary disease)   . Asthma     Past Surgical History  Procedure Date  . Replacement total knee     No family history on file.  History  Substance Use Topics  . Smoking status: Current Some Day Smoker  . Smokeless tobacco: Not on file  . Alcohol Use: Yes      Review of Systems  Constitutional: Negative for fever and chills.  Respiratory: Positive for cough and shortness of breath. Negative for chest tightness, wheezing and stridor.   Cardiovascular: Negative.   Gastrointestinal: Negative.   Musculoskeletal: Positive for joint swelling.  Skin: Negative.   Neurological: Negative for dizziness, weakness and headaches.    Allergies  Review of patient's allergies indicates no known allergies.  Home Medications   Current Outpatient Rx  Name Route Sig Dispense Refill  . ALBUTEROL SULFATE HFA 108 (90 BASE) MCG/ACT IN AERS Inhalation Inhale 2 puffs into the lungs every 6 (six) hours as needed for wheezing. For shortness of breath and cough 6.7 g 1    . ALBUTEROL SULFATE (5 MG/ML) 0.5% IN NEBU Nebulization Take 2.5 mg by nebulization every 6 (six) hours as needed. For shortness of breath    . AMLODIPINE BESYLATE 10 MG PO TABS Oral Take 1 tablet (10 mg total) by mouth daily. 30 tablet 0  . FLUTICASONE PROPIONATE 50 MCG/ACT NA SUSP Nasal Place 2 sprays into the nose at bedtime.      Marland Kitchen FLUTICASONE-SALMETEROL 500-50 MCG/DOSE IN AEPB Inhalation Inhale 1 puff into the lungs every 12 (twelve) hours. 60 each 0  . HYDROCHLOROTHIAZIDE 12.5 MG PO CAPS Oral Take 1 capsule (12.5 mg total) by mouth daily. 30 capsule 2  . HYDROCODONE-ACETAMINOPHEN 10-325 MG PO TABS Oral Take 1 tablet by mouth every 6 (six) hours as needed. For pain 10 tablet 0  . MONTELUKAST SODIUM 10 MG PO TABS Oral Take 1 tablet (10 mg total) by mouth at bedtime. 30 tablet 0  . PANTOPRAZOLE SODIUM 40 MG PO TBEC Oral Take 1 tablet (40 mg total) by mouth daily. 15 tablet 0  . PREDNISONE 20 MG PO TABS Oral Take 1 tablet (20 mg total) by mouth daily. 15 tablet 0    Take 40 mg po daily for 3 days, then 30 mg po dail ...  . PREDNISONE 5 MG PO TABS Oral Take 1 tablet (5 mg total) by mouth daily. 15 tablet 0  . TIOTROPIUM BROMIDE MONOHYDRATE 18 MCG IN CAPS Inhalation Place 1 capsule (18 mcg total) into inhaler and inhale daily.  30 capsule 0  . VALSARTAN 160 MG PO TABS Oral Take 2 tablets (320 mg total) by mouth daily. 30 tablet 0    BP 165/109  Pulse 87  Temp 97 F (36.1 C) (Oral)  Ht 5\' 6"  (1.676 m)  Wt 145 lb (65.772 kg)  BMI 23.40 kg/m2  SpO2 97%  Physical Exam  Nursing note and vitals reviewed. Constitutional: He is oriented to person, place, and time. He appears well-developed and well-nourished. No distress.  Eyes: Conjunctivae normal are normal.  Neck: Neck supple.  Cardiovascular: Normal rate, regular rhythm and normal heart sounds.   Pulmonary/Chest: Effort normal. No respiratory distress. He has wheezes. He has no rales.       Left lung expiratory wheezes noted   Musculoskeletal:       Right knee swelling and healed surgical incision noted. Pt ambulatory. Full rom of the joint. Joint stable  Neurological: He is alert and oriented to person, place, and time.  Skin: Skin is warm and dry.    ED Course  Procedures (including critical care time)  Pt is here for medications refills. I will not refill pt's vicodin. Pt is very argumentative about this, he states he has chronic pain no new injuries. States was referred to pain management, but states its "too muc work" to send records and get an appointment. Recommended tylenol  And motrin for pain, which he states he cannot take, although it is part of vicodin that he is requesting. Will d.c home with refill on pts prednisone and diovan. VS normal other than hypertensive. Pt in NAD.   Filed Vitals:   04/24/12 1200  BP: 165/109  Pulse: 87  Temp: 97 F (36.1 C)     1. Wheezing   2. Medication refill       MDM          Lottie Mussel, PA 04/24/12 2053

## 2012-04-24 NOTE — ED Notes (Signed)
Patient states he is here for his medication prescriptions refill.  He has been unable to get a refill since [facility] has closed.

## 2012-04-26 NOTE — ED Provider Notes (Signed)
Medical screening examination/treatment/procedure(s) were performed by non-physician practitioner and as supervising physician I was immediately available for consultation/collaboration. Myers Tutterow, MD, FACEP   Chaunda Vandergriff L Michael Walrath, MD 04/26/12 1503 

## 2013-07-22 ENCOUNTER — Emergency Department (HOSPITAL_COMMUNITY): Payer: Medicare Other

## 2013-07-22 ENCOUNTER — Emergency Department (HOSPITAL_COMMUNITY)
Admission: EM | Admit: 2013-07-22 | Discharge: 2013-07-23 | Disposition: A | Discharge status: Home / Self Care | Payer: Medicare Other | Attending: Emergency Medicine | Admitting: Emergency Medicine

## 2013-07-22 ENCOUNTER — Encounter (HOSPITAL_COMMUNITY): Payer: Self-pay | Admitting: Emergency Medicine

## 2013-07-22 DIAGNOSIS — J449 Chronic obstructive pulmonary disease, unspecified: Secondary | ICD-10-CM

## 2013-07-22 DIAGNOSIS — M129 Arthropathy, unspecified: Secondary | ICD-10-CM | POA: Insufficient documentation

## 2013-07-22 DIAGNOSIS — IMO0002 Reserved for concepts with insufficient information to code with codable children: Secondary | ICD-10-CM | POA: Insufficient documentation

## 2013-07-22 DIAGNOSIS — Z79899 Other long term (current) drug therapy: Secondary | ICD-10-CM | POA: Insufficient documentation

## 2013-07-22 DIAGNOSIS — J441 Chronic obstructive pulmonary disease with (acute) exacerbation: Secondary | ICD-10-CM | POA: Insufficient documentation

## 2013-07-22 DIAGNOSIS — I10 Essential (primary) hypertension: Secondary | ICD-10-CM | POA: Insufficient documentation

## 2013-07-22 DIAGNOSIS — J45901 Unspecified asthma with (acute) exacerbation: Principal | ICD-10-CM

## 2013-07-22 DIAGNOSIS — F17200 Nicotine dependence, unspecified, uncomplicated: Secondary | ICD-10-CM | POA: Insufficient documentation

## 2013-07-22 LAB — CBC WITH DIFFERENTIAL/PLATELET
BASOS ABS: 0.1 10*3/uL (ref 0.0–0.1)
Basophils Relative: 1 % (ref 0–1)
Eosinophils Absolute: 0.6 10*3/uL (ref 0.0–0.7)
Eosinophils Relative: 10 % — ABNORMAL HIGH (ref 0–5)
HEMATOCRIT: 41.2 % (ref 39.0–52.0)
HEMOGLOBIN: 14.4 g/dL (ref 13.0–17.0)
LYMPHS PCT: 28 % (ref 12–46)
Lymphs Abs: 1.7 10*3/uL (ref 0.7–4.0)
MCH: 31.5 pg (ref 26.0–34.0)
MCHC: 35 g/dL (ref 30.0–36.0)
MCV: 90.2 fL (ref 78.0–100.0)
MONO ABS: 0.6 10*3/uL (ref 0.1–1.0)
Monocytes Relative: 10 % (ref 3–12)
NEUTROS ABS: 3.1 10*3/uL (ref 1.7–7.7)
Neutrophils Relative %: 52 % (ref 43–77)
Platelets: 328 10*3/uL (ref 150–400)
RBC: 4.57 MIL/uL (ref 4.22–5.81)
RDW: 14.6 % (ref 11.5–15.5)
WBC: 6 10*3/uL (ref 4.0–10.5)

## 2013-07-22 LAB — COMPREHENSIVE METABOLIC PANEL
ALK PHOS: 69 U/L (ref 39–117)
ALT: 9 U/L (ref 0–53)
AST: 11 U/L (ref 0–37)
Albumin: 3.5 g/dL (ref 3.5–5.2)
BILIRUBIN TOTAL: 0.5 mg/dL (ref 0.3–1.2)
BUN: 8 mg/dL (ref 6–23)
CHLORIDE: 99 meq/L (ref 96–112)
CO2: 26 meq/L (ref 19–32)
CREATININE: 1 mg/dL (ref 0.50–1.35)
Calcium: 9.1 mg/dL (ref 8.4–10.5)
GFR calc Af Amer: >90 mL/min (ref >90)
GFR, EST NON AFRICAN AMERICAN: 83 mL/min — AB (ref >90)
Glucose, Bld: 82 mg/dL (ref 70–99)
Potassium: 5 mEq/L (ref 3.7–5.3)
Sodium: 137 mEq/L (ref 137–147)
Total Protein: 7.2 g/dL (ref 6.0–8.3)

## 2013-07-22 LAB — POCT I-STAT TROPONIN I: Troponin i, poc: 0 ng/mL (ref 0.00–0.08)

## 2013-07-22 IMAGING — CR DG CHEST 1V PORT
1 series · 1 of 1 positions shown · non-contrast
Comparison: Chest x-ray 06/09/2011.

CLINICAL DATA: Shortness of breath.

PORTABLE CHEST - 1 VIEW

[AP]
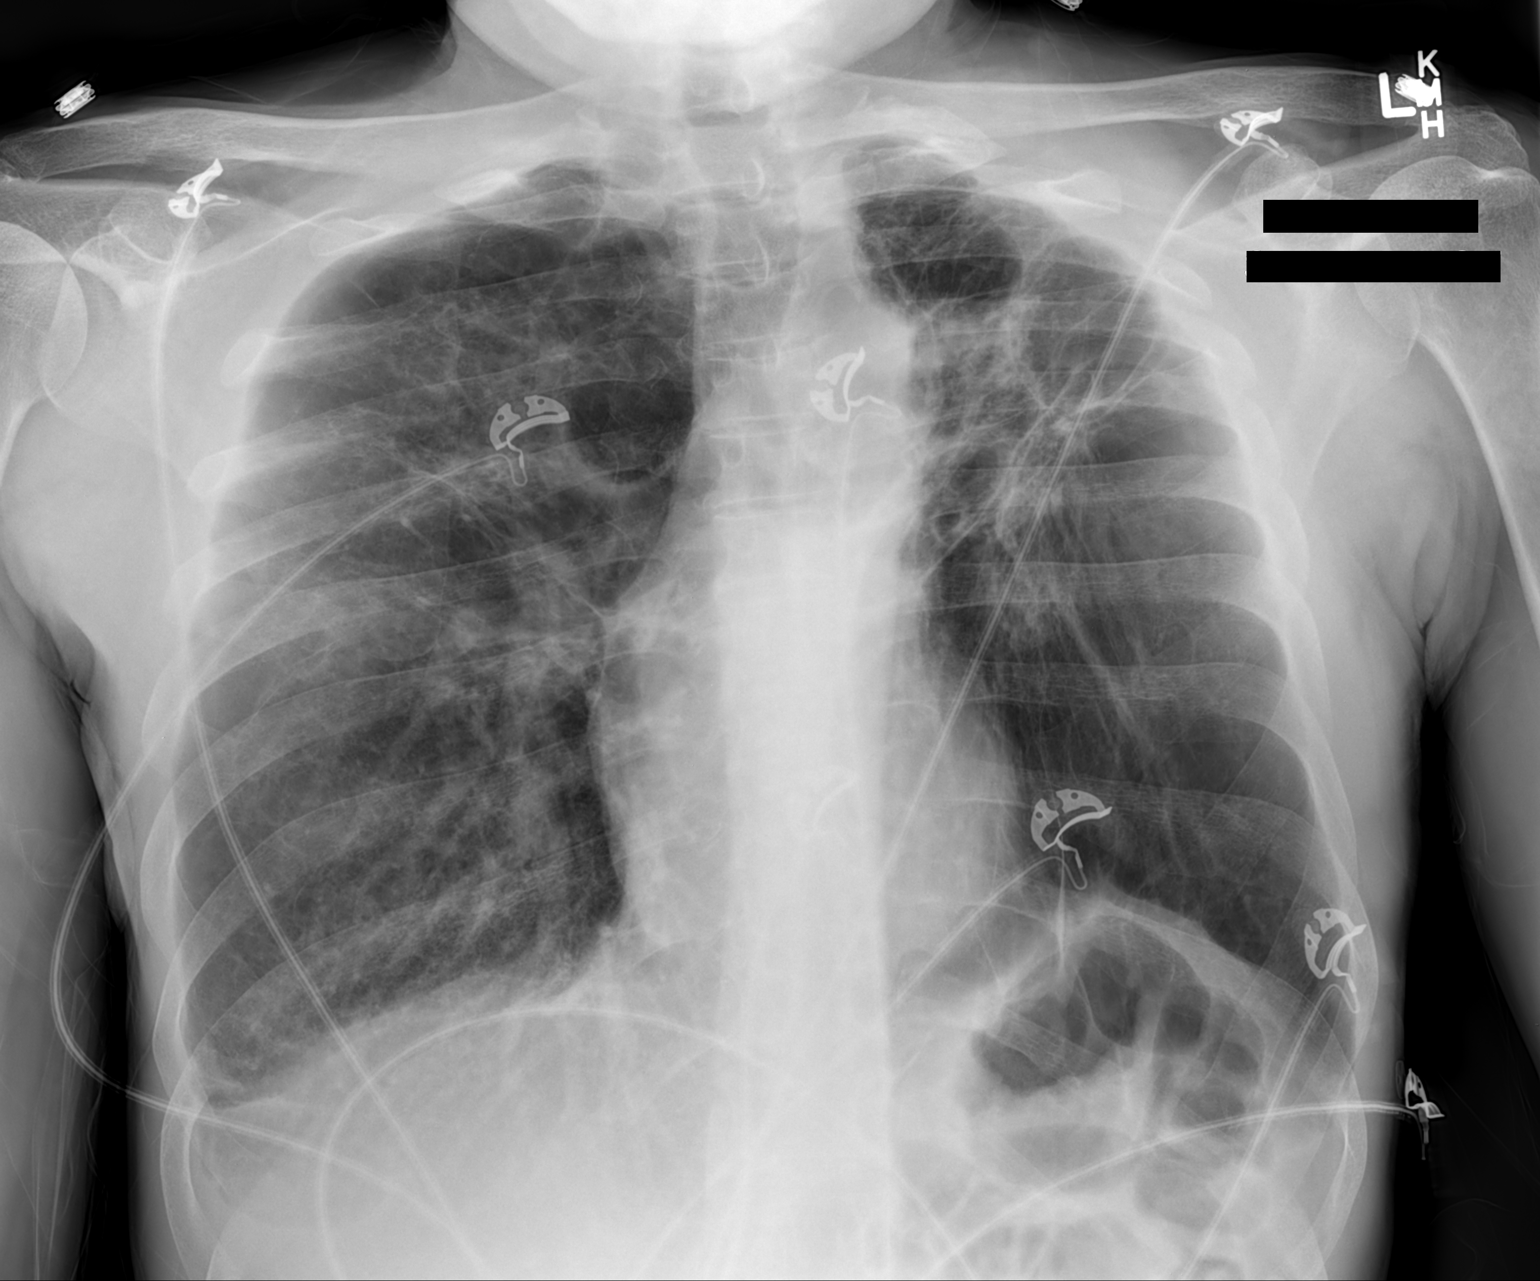

[1 of 1 positions shown; findings below may reference images not displayed]

FINDINGS: Lungs appear hyperexpanded with flattening of the
hemidiaphragms, increased retrosternal air space and pruning of the
pulmonary vasculature in the periphery, suggestive of underlying
COPD.  Advanced bilateral upper lobes scarring (left greater than
right) with extensive architectural distortion is again noted.
Coarsened interstitial markings are seen throughout the lungs
bilaterally.  No definite acute consolidative air space disease is
noted.  No definite pleural effusions.  Chronic blunting of the
costophrenic sulci as compatible with scarring (unchanged).  No
definite evidence of pulmonary edema.  Heart size is normal.
IMPRESSION: 1.  Overall, the appearance of the chest is similar to prior
examinations, as detailed above.  No definite radiographic evidence
of acute cardiopulmonary disease.

## 2013-07-22 NOTE — ED Notes (Signed)
Pt states hx of asthma and he is out of his 5mg  of prednisone (home medication, which usually causes chest tightness.) Pt states SOB and tightness when he moves around. Pt has been using his home nebulizers to help with the SOB. Pt states that the tightness started last night.

## 2013-07-23 LAB — POCT I-STAT TROPONIN I: Troponin i, poc: 0.01 ng/mL (ref 0.00–0.08)

## 2013-07-23 MED ORDER — ALBUTEROL SULFATE HFA 108 (90 BASE) MCG/ACT IN AERS
INHALATION_SPRAY | RESPIRATORY_TRACT | Status: AC
  Filled 2013-07-23: qty 6.7

## 2013-07-23 MED ORDER — ALBUTEROL SULFATE (2.5 MG/3ML) 0.083% IN NEBU
5.0000 mg | INHALATION_SOLUTION | Freq: Once | RESPIRATORY_TRACT | Status: AC
  Administered 2013-07-23: 5 mg via RESPIRATORY_TRACT
  Filled 2013-07-23: qty 6

## 2013-07-23 MED ORDER — IPRATROPIUM BROMIDE 0.02 % IN SOLN
0.5000 mg | Freq: Once | RESPIRATORY_TRACT | Status: AC
  Administered 2013-07-23: 0.5 mg via RESPIRATORY_TRACT
  Filled 2013-07-23: qty 2.5

## 2013-07-23 MED ORDER — DEXAMETHASONE SODIUM PHOSPHATE 10 MG/ML IJ SOLN
10.0000 mg | Freq: Once | INTRAMUSCULAR | Status: AC
  Administered 2013-07-23: 10 mg via INTRAMUSCULAR
  Filled 2013-07-23: qty 1

## 2013-07-23 MED ORDER — PREDNISONE 20 MG PO TABS: ORAL_TABLET | ORAL | Status: DC

## 2013-07-23 MED ORDER — ALBUTEROL SULFATE HFA 108 (90 BASE) MCG/ACT IN AERS
2.0000 | INHALATION_SPRAY | Freq: Once | RESPIRATORY_TRACT | Status: AC
  Administered 2013-07-23: 2 via RESPIRATORY_TRACT

## 2013-07-23 NOTE — ED Provider Notes (Signed)
CSN: [id number]     Arrival date & time 07/22/13  1945 History   First MD Initiated Contact with Patient 07/22/13 2335     Chief Complaint  Patient presents with  . Chest Pain  . Shortness of Breath   (Consider location/radiation/quality/duration/timing/severity/associated sxs/prior Treatment) Patient is a 53 y.o. male presenting with wheezing. The history is provided by the patient. No language interpreter was used.  Wheezing Severity:  Moderate Severity compared to prior episodes:  Similar Onset quality:  Gradual Timing:  Constant Progression:  Worsening Chronicity:  Chronic Context: smoke exposure   Context: not pollens   Relieved by:  Nothing Worsened by:  Nothing tried Ineffective treatments:  None tried Associated symptoms: no chest pain and no fever   Risk factors: no suspected foreign body     Past Medical History  Diagnosis Date  . Hypertension   . Arthritis   . COPD (chronic obstructive pulmonary disease)   . Asthma    Past Surgical History  Procedure Laterality Date  . Replacement total knee     History reviewed. No pertinent family history. History  Substance Use Topics  . Smoking status: Current Some Day Smoker  . Smokeless tobacco: Not on file  . Alcohol Use: Yes    Review of Systems  Constitutional: Negative for fever.  Respiratory: Positive for wheezing.   Cardiovascular: Negative for chest pain.  All other systems reviewed and are negative.    Allergies  Review of patient's allergies indicates no known allergies.  Home Medications   Current Outpatient Rx  Name  Route  Sig  Dispense  Refill  . albuterol (PROVENTIL HFA;VENTOLIN HFA) 108 (90 BASE) MCG/ACT inhaler   Inhalation   Inhale 2 puffs into the lungs every 6 (six) hours as needed for wheezing. For shortness of breath and cough   6.7 g   1   . budesonide-formoterol (SYMBICORT) 160-4.5 MCG/ACT inhaler   Inhalation   Inhale 2 puffs into the lungs 2 (two) times daily.         .  fluticasone (FLONASE) 50 MCG/ACT nasal spray   Nasal   Place 2 sprays into the nose at bedtime.           . montelukast (SINGULAIR) 10 MG tablet   Oral   Take 1 tablet (10 mg total) by mouth at bedtime.   30 tablet   0   . oxyCODONE-acetaminophen (PERCOCET) 10-325 MG per tablet   Oral   Take 2 tablets by mouth every 6 (six) hours as needed for pain.         . predniSONE (DELTASONE) 5 MG tablet   Oral   Take 1 tablet (5 mg total) by mouth daily.   30 tablet   1   . EXPIRED: tiotropium (SPIRIVA) 18 MCG inhalation capsule   Inhalation   Place 1 capsule (18 mcg total) into inhaler and inhale daily.   30 capsule   0   . valsartan-hydrochlorothiazide (DIOVAN-HCT) 320-25 MG per tablet   Oral   Take 1 tablet by mouth daily.          BP 168/109  Pulse 88  Temp(Src) 97.9 F (36.6 C) (Oral)  Resp 22  Ht 5\' 5"  (1.651 m)  Wt 145 lb (65.772 kg)  BMI 24.13 kg/m2  SpO2 99% Physical Exam  Constitutional: He is oriented to person, place, and time. He appears well-developed and well-nourished. No distress.  HENT:  Head: Normocephalic and atraumatic.  Mouth/Throat: Oropharynx is clear and moist.  Eyes: Conjunctivae are normal. Pupils are equal, round, and reactive to light.  Neck: Normal range of motion. Neck supple.  Cardiovascular: Normal rate, regular rhythm and intact distal pulses.   Pulmonary/Chest: No stridor. He has wheezes. He has no rales.  Abdominal: Soft. Bowel sounds are normal. There is no tenderness. There is no rebound and no guarding.  Musculoskeletal: Normal range of motion.  Neurological: He is alert and oriented to person, place, and time.  Skin: Skin is warm and dry.  Psychiatric: He has a normal mood and affect.    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 No pneumothorax, pulmonary consolidation or edema is identified. The heart size and mediastinal contours are stable and within normal limits. No focal masses are seen. Bony structures are unremarkable.  IMPRESSION: Stable severe chronic lung disease.   Electronically Signed   By: Irish LackGlenn  Yamagata M.D.   On: 07/22/2013 20:28    EKG Interpretation    Date/Time:  Tuesday July 22 2013 19:48:35 EST Ventricular Rate:  91 PR Interval:  136 QRS Duration: 88 QT Interval:  348 QTC Calculation: 428 R Axis:   89 Text Interpretation:  Normal sinus rhythm Right atrial enlargement Left ventricular hypertrophy with repolarization abnormality Confirmed by [facility]  MD, Kally Cadden ([id number]) on 07/22/2013 11:40:21 PM            MDM  No diagnosis found. Doubt ACS.  Perc and wells negative doubt PE exam consistent with COPD has inhalers wants steroids.  Will d/c with same    Aminata [place] Smitty CordsK Gwendolen Hewlett-Rasch, MD 07/23/13 [id number]

## 2013-07-23 NOTE — Discharge Instructions (Signed)

## 2013-10-15 ENCOUNTER — Emergency Department (HOSPITAL_COMMUNITY): Payer: Medicare Other

## 2013-10-15 ENCOUNTER — Encounter (HOSPITAL_COMMUNITY): Payer: Self-pay | Admitting: Emergency Medicine

## 2013-10-15 ENCOUNTER — Emergency Department (HOSPITAL_COMMUNITY)
Admission: EM | Admit: 2013-10-15 | Discharge: 2013-10-15 | Disposition: A | Discharge status: Home / Self Care | Payer: Medicare Other | Attending: Emergency Medicine | Admitting: Emergency Medicine

## 2013-10-15 DIAGNOSIS — M129 Arthropathy, unspecified: Secondary | ICD-10-CM | POA: Insufficient documentation

## 2013-10-15 DIAGNOSIS — Z79899 Other long term (current) drug therapy: Secondary | ICD-10-CM | POA: Insufficient documentation

## 2013-10-15 DIAGNOSIS — F17200 Nicotine dependence, unspecified, uncomplicated: Secondary | ICD-10-CM | POA: Insufficient documentation

## 2013-10-15 DIAGNOSIS — J45901 Unspecified asthma with (acute) exacerbation: Principal | ICD-10-CM

## 2013-10-15 DIAGNOSIS — J441 Chronic obstructive pulmonary disease with (acute) exacerbation: Secondary | ICD-10-CM | POA: Insufficient documentation

## 2013-10-15 DIAGNOSIS — IMO0002 Reserved for concepts with insufficient information to code with codable children: Secondary | ICD-10-CM | POA: Insufficient documentation

## 2013-10-15 LAB — COMPREHENSIVE METABOLIC PANEL
ALT: 8 U/L (ref 0–53)
AST: 27 U/L (ref 0–37)
Albumin: 3.3 g/dL — ABNORMAL LOW (ref 3.5–5.2)
Alkaline Phosphatase: 77 U/L (ref 39–117)
BILIRUBIN TOTAL: 0.5 mg/dL (ref 0.3–1.2)
BUN: 5 mg/dL — ABNORMAL LOW (ref 6–23)
CALCIUM: 9.1 mg/dL (ref 8.4–10.5)
CO2: 23 meq/L (ref 19–32)
CREATININE: 0.92 mg/dL (ref 0.50–1.35)
Chloride: 94 mEq/L — ABNORMAL LOW (ref 96–112)
GLUCOSE: 124 mg/dL — AB (ref 70–99)
Potassium: 3.8 mEq/L (ref 3.7–5.3)
Sodium: 132 mEq/L — ABNORMAL LOW (ref 137–147)
Total Protein: 7.7 g/dL (ref 6.0–8.3)

## 2013-10-15 LAB — I-STAT TROPONIN, ED: Troponin i, poc: 0.01 ng/mL (ref 0.00–0.08)

## 2013-10-15 LAB — CBC
HEMATOCRIT: 39 % (ref 39.0–52.0)
HEMOGLOBIN: 14 g/dL (ref 13.0–17.0)
MCH: 31.8 pg (ref 26.0–34.0)
MCHC: 35.9 g/dL (ref 30.0–36.0)
MCV: 88.6 fL (ref 78.0–100.0)
Platelets: 456 10*3/uL — ABNORMAL HIGH (ref 150–400)
RBC: 4.4 MIL/uL (ref 4.22–5.81)
RDW: 13.2 % (ref 11.5–15.5)
WBC: 8.4 10*3/uL (ref 4.0–10.5)

## 2013-10-15 MED ORDER — METHYLPREDNISOLONE SODIUM SUCC 125 MG IJ SOLR: 125.0000 mg | Freq: Once | INTRAMUSCULAR | Status: DC

## 2013-10-15 MED ORDER — METHYLPREDNISOLONE SODIUM SUCC 125 MG IJ SOLR
125.0000 mg | Freq: Once | INTRAMUSCULAR | Status: AC
  Administered 2013-10-15: 125 mg via INTRAMUSCULAR
  Filled 2013-10-15: qty 2

## 2013-10-15 MED ORDER — TIOTROPIUM BROMIDE MONOHYDRATE 18 MCG IN CAPS: 18.0000 ug | ORAL_CAPSULE | Freq: Every morning | RESPIRATORY_TRACT | Status: AC

## 2013-10-15 MED ORDER — ALBUTEROL SULFATE (2.5 MG/3ML) 0.083% IN NEBU
2.5000 mg | INHALATION_SOLUTION | RESPIRATORY_TRACT | Status: DC | PRN
  Administered 2013-10-15: 2.5 mg via RESPIRATORY_TRACT
  Filled 2013-10-15: qty 3

## 2013-10-15 MED ORDER — PREDNISONE 10 MG PO TABS: 20.0000 mg | ORAL_TABLET | Freq: Every day | ORAL | Status: DC

## 2013-10-15 NOTE — ED Provider Notes (Signed)
CSN: [id number]     Arrival date & time 10/15/13  1223 History   First MD Initiated Contact with Patient 10/15/13 1255     Chief Complaint  Patient presents with  . Shortness of Breath  . Chest Pain      HPI  Patient presents with shortness of breath. States ran out of his prednisone 5 days ago..5 mg a day for years previously that his breathing. Also seasonal allergies. Also out of his Singulair. No chest pain just as a tightness. Occasional cough, nonproductive. No peripheral edema. No chest pain.  Past Medical History  Diagnosis Date  . Hypertension   . Arthritis   . COPD (chronic obstructive pulmonary disease)   . Asthma    Past Surgical History  Procedure Laterality Date  . Replacement total knee     History reviewed. No pertinent family history. History  Substance Use Topics  . Smoking status: Current Some Day Smoker  . Smokeless tobacco: Not on file  . Alcohol Use: Yes    Review of Systems  Constitutional: Negative for fever, chills, diaphoresis, appetite change and fatigue.  HENT: Negative for mouth sores, sore throat and trouble swallowing.   Eyes: Negative for visual disturbance.  Respiratory: Positive for chest tightness and shortness of breath. Negative for cough and wheezing.   Cardiovascular: Negative for chest pain.  Gastrointestinal: Negative for nausea, vomiting, abdominal pain, diarrhea and abdominal distention.  Endocrine: Negative for polydipsia, polyphagia and polyuria.  Genitourinary: Negative for dysuria, frequency and hematuria.  Musculoskeletal: Negative for gait problem.  Skin: Negative for color change, pallor and rash.  Neurological: Negative for dizziness, syncope, light-headedness and headaches.  Hematological: Does not bruise/bleed easily.  Psychiatric/Behavioral: Negative for behavioral problems and confusion.      Allergies  Review of patient's allergies indicates no known allergies.  Home Medications   Current Outpatient Rx   Name  Route  Sig  Dispense  Refill  . albuterol (PROVENTIL HFA;VENTOLIN HFA) 108 (90 BASE) MCG/ACT inhaler   Inhalation   Inhale 2 puffs into the lungs every 6 (six) hours as needed for wheezing. For shortness of breath and cough   6.7 g   1   . budesonide-formoterol (SYMBICORT) 160-4.5 MCG/ACT inhaler   Inhalation   Inhale 2 puffs into the lungs 2 (two) times daily.         . fluticasone (FLONASE) 50 MCG/ACT nasal spray   Nasal   Place 2 sprays into the nose at bedtime.           . montelukast (SINGULAIR) 10 MG tablet   Oral   Take 1 tablet (10 mg total) by mouth at bedtime.   30 tablet   0   . oxyCODONE-acetaminophen (PERCOCET) 10-325 MG per tablet   Oral   Take 2 tablets by mouth every 6 (six) hours as needed for pain.         Marland Kitchen. tiotropium (SPIRIVA) 18 MCG inhalation capsule   Inhalation   Place 18 mcg into inhaler and inhale daily.         . valsartan-hydrochlorothiazide (DIOVAN-HCT) 320-25 MG per tablet   Oral   Take 1 tablet by mouth daily.         . predniSONE (DELTASONE) 10 MG tablet   Oral   Take 2 tablets (20 mg total) by mouth daily.   10 tablet   0   . predniSONE (DELTASONE) 5 MG tablet   Oral   Take 1 tablet (5 mg total) by mouth  daily.   30 tablet   1   . tiotropium (SPIRIVA HANDIHALER) 18 MCG inhalation capsule   Inhalation   Place 1 capsule (18 mcg total) into inhaler and inhale every morning.   30 capsule   0    BP 104/54  Pulse 100  Temp(Src) 97.9 F (36.6 C) (Oral)  Resp 20  SpO2 96% Physical Exam  Constitutional: He is oriented to person, place, and time. He appears well-developed and well-nourished. No distress.  HENT:  Head: Normocephalic.  Eyes: Conjunctivae are normal. Pupils are equal, round, and reactive to light. No scleral icterus.  Neck: Normal range of motion. Neck supple. No thyromegaly present.  Cardiovascular: Normal rate and regular rhythm.  Exam reveals no gallop and no friction rub.   No murmur  heard. Pulmonary/Chest: Effort normal. No respiratory distress. He has wheezes in the right upper field, the right middle field, the right lower field, the left upper field, the left middle field and the left lower field. He has rhonchi in the right upper field, the right middle field, the right lower field, the left upper field, the left middle field and the left lower field. He has no rales.  Abdominal: Soft. Bowel sounds are normal. He exhibits no distension. There is no tenderness. There is no rebound.  Musculoskeletal: Normal range of motion.  Neurological: He is alert and oriented to person, place, and time.  Skin: Skin is warm and dry. No rash noted.  Psychiatric: He has a normal mood and affect. His behavior is normal.    ED Course  Procedures (including critical care time) Labs Review Labs Reviewed  CBC - Abnormal; Notable for the following:    Platelets 456 (*)    All other components within normal limits  COMPREHENSIVE METABOLIC PANEL - Abnormal; Notable for the following:    Sodium 132 (*)    Chloride 94 (*)    Glucose, Bld 124 (*)    BUN 5 (*)    Albumin 3.3 (*)    All other components within normal limits  I-STAT TROPOININ, ED   Imaging Review Dg Chest Portable 1 View  10/15/2013   CLINICAL DATA:  Chest pressure, shortness of breath.  EXAM: PORTABLE CHEST - 1 VIEW  COMPARISON:  Prior radiograph from 07/22/2013  FINDINGS: Cardiac and mediastinal silhouettes are stable in size and contour.  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 No pneumothorax. No focal infiltrates identified. Pulmonary vascularity is normal.  No acute osseous abnormality.  IMPRESSION: Stable appearance of chronic lung disease. No acute cardiopulmonary abnormality identified.   Electronically Signed   By: Rise Mu M.D.   On: 10/15/2013 13:51     EKG Interpretation None      MDM   Final diagnoses:  COPD  exacerbation    Feeling much improved. Well oxygenated. Improved aeration on exam. No additional wheezing. Plan to discharge home. Prednisone prescription. Medication refills.    [name], MD 10/15/13 [phone]

## 2013-10-15 NOTE — ED Notes (Signed)
Sob and tightness in chest since last night pt states has copd no n/v

## 2013-10-15 NOTE — ED Notes (Signed)
Pt getting dressed.

## 2013-10-15 NOTE — Discharge Instructions (Signed)
Chronic Obstructive Pulmonary Disease Exacerbation °Chronic obstructive pulmonary disease (COPD) is a common lung condition in which airflow from the lungs is limited. COPD is a general term that can be used to describe many different lung problems that limit airflow, including chronic bronchitis and emphysema. COPD exacerbations are episodes when breathing symptoms become much worse and require extra treatment. Without treatment, COPD exacerbations can be life threatening, and frequent COPD exacerbations can cause further damage to your lungs. °CAUSES  °· Respiratory infections.   °· Exposure to smoke.   °· Exposure to air pollution, chemical fumes, or dust. °Sometimes there is no apparent cause or trigger. °RISK FACTORS °· Smoking cigarettes. °· Older age. °· Frequent prior COPD exacerbations. °SIGNS AND SYMPTOMS  °· Increased coughing.   °· Increased thick spit (sputum) production.   °· Increased wheezing.   °· Increased shortness of breath.   °· Rapid breathing.   °· Chest tightness. °DIAGNOSIS  °Your medical history, a physical exam, and tests will help your health care provider make a diagnosis. Tests may include: °· A chest X-ray. °· Basic lab tests. °· Sputum testing. °· An arterial blood gas test. °TREATMENT  °Depending on the severity of your COPD exacerbation, you may need to be admitted to a hospital for treatment. Some of the treatments commonly used to treat COPD exacerbations are:  °· Antibiotic medicines.   °· Bronchodilators. These are drugs that expand the air passages. They may be given with an inhaler or nebulizer. Spacer devices may be needed to help improve drug delivery. °· Corticosteroid medicines. °· Supplemental oxygen therapy.   °HOME CARE INSTRUCTIONS  °· Do not smoke. Quitting smoking is very important to prevent COPD from getting worse and exacerbations from happening as often. °· Avoid exposure to all substances that irritate the airway, especially to tobacco smoke.   °· If prescribed,  take your antibiotics as directed. Finish them even if you start to feel better. °· Only take over-the-counter or prescription medicines as directed by your health care provider. It is important to use correct technique with inhaled medicines. °· Drink enough fluids to keep your urine clear or pale yellow (unless you have a medical condition that requires fluid restriction). °· Use a cool mist vaporizer. This makes it easier to clear your chest when you cough.   °· If you have a home nebulizer and oxygen, continue to use them as directed.   °· Maintain all necessary vaccinations to prevent infections.   °· Exercise regularly.   °· Eat a healthy diet.   °· Keep all follow-up appointments as directed by your health care provider. °SEEK IMMEDIATE MEDICAL CARE IF: °· You have worsening shortness of breath.   °· You have trouble talking.   °· You have severe chest pain. °· You have blood in your sputum.  °· You have a fever. °· You have weakness, vomit repeatedly, or faint.   °· You feel confused.   °· You continue to get worse. °MAKE SURE YOU:  °· Understand these instructions. °· Will watch your condition. °· Will get help right away if you are not doing well or get worse. °Document Released: 04/30/2007 Document Revised: 04/23/2013 Document Reviewed: 03/07/2013 °Exit[employer]® Patient Information ©2014 [employer]. ° °

## 2014-01-06 ENCOUNTER — Emergency Department (HOSPITAL_COMMUNITY)
Admission: EM | Admit: 2014-01-06 | Discharge: 2014-01-06 | Disposition: A | Discharge status: Home / Self Care | Payer: Medicare Other | Attending: Emergency Medicine | Admitting: Emergency Medicine

## 2014-01-06 ENCOUNTER — Encounter (HOSPITAL_COMMUNITY): Payer: Self-pay | Admitting: Emergency Medicine

## 2014-01-06 DIAGNOSIS — Z79899 Other long term (current) drug therapy: Secondary | ICD-10-CM | POA: Diagnosis not present

## 2014-01-06 DIAGNOSIS — IMO0002 Reserved for concepts with insufficient information to code with codable children: Secondary | ICD-10-CM | POA: Diagnosis not present

## 2014-01-06 DIAGNOSIS — J441 Chronic obstructive pulmonary disease with (acute) exacerbation: Secondary | ICD-10-CM

## 2014-01-06 DIAGNOSIS — R0602 Shortness of breath: Secondary | ICD-10-CM | POA: Diagnosis present

## 2014-01-06 DIAGNOSIS — F17200 Nicotine dependence, unspecified, uncomplicated: Secondary | ICD-10-CM | POA: Diagnosis not present

## 2014-01-06 DIAGNOSIS — M129 Arthropathy, unspecified: Secondary | ICD-10-CM | POA: Insufficient documentation

## 2014-01-06 DIAGNOSIS — I10 Essential (primary) hypertension: Secondary | ICD-10-CM | POA: Insufficient documentation

## 2014-01-06 DIAGNOSIS — J45901 Unspecified asthma with (acute) exacerbation: Principal | ICD-10-CM

## 2014-01-06 LAB — BASIC METABOLIC PANEL
BUN: 8 mg/dL (ref 6–23)
CALCIUM: 9.2 mg/dL (ref 8.4–10.5)
CO2: 22 mEq/L (ref 19–32)
Chloride: 93 mEq/L — ABNORMAL LOW (ref 96–112)
Creatinine, Ser: 0.91 mg/dL (ref 0.50–1.35)
GFR calc Af Amer: >90 mL/min (ref >90)
Glucose, Bld: 100 mg/dL — ABNORMAL HIGH (ref 70–99)
Potassium: 4.6 mEq/L (ref 3.7–5.3)
SODIUM: 129 meq/L — AB (ref 137–147)

## 2014-01-06 LAB — CBC
HCT: 39.2 % (ref 39.0–52.0)
HEMOGLOBIN: 14 g/dL (ref 13.0–17.0)
MCH: 31.3 pg (ref 26.0–34.0)
MCHC: 35.7 g/dL (ref 30.0–36.0)
MCV: 87.5 fL (ref 78.0–100.0)
Platelets: 369 10*3/uL (ref 150–400)
RBC: 4.48 MIL/uL (ref 4.22–5.81)
RDW: 12.9 % (ref 11.5–15.5)
WBC: 5.5 10*3/uL (ref 4.0–10.5)

## 2014-01-06 MED ORDER — ALBUTEROL SULFATE HFA 108 (90 BASE) MCG/ACT IN AERS
1.0000 | INHALATION_SPRAY | RESPIRATORY_TRACT | Status: DC | PRN
  Administered 2014-01-06: 2 via RESPIRATORY_TRACT
  Filled 2014-01-06: qty 6.7

## 2014-01-06 MED ORDER — PREDNISONE 20 MG PO TABS
60.0000 mg | ORAL_TABLET | Freq: Once | ORAL | Status: AC
  Administered 2014-01-06: 60 mg via ORAL
  Filled 2014-01-06: qty 3

## 2014-01-06 MED ORDER — ALBUTEROL (5 MG/ML) CONTINUOUS INHALATION SOLN
10.0000 mg/h | INHALATION_SOLUTION | Freq: Once | RESPIRATORY_TRACT | Status: AC
  Administered 2014-01-06: 10 mg/h via RESPIRATORY_TRACT
  Filled 2014-01-06: qty 20

## 2014-01-06 MED ORDER — PREDNISONE (PAK) 10 MG PO TABS: ORAL_TABLET | ORAL | Status: DC

## 2014-01-06 NOTE — ED Provider Notes (Signed)
CSN: [id number]     Arrival date & time 01/06/14  1200 History   First MD Initiated Contact with Patient 01/06/14 1210     Chief Complaint  Patient presents with  . Shortness of Breath    Patient is a 53 y.o. male presenting with shortness of breath. The history is provided by the patient.  Shortness of Breath Severity:  Moderate Onset quality:  Gradual Timing:  Constant Progression:  Worsening Chronicity:  Chronic Relieved by:  Nothing Worsened by:  Activity Ineffective treatments:  Inhaler Associated symptoms: cough (mild) and wheezing   Associated symptoms: no chest pain, no fever, no sore throat and no vomiting   Has history of chronic lung disease.  Tried his nebulizer but his lungs still feel like they wont open up.  He ran out of his symbicort and will not be able to pick it up until the 1st.   Past Medical History  Diagnosis Date  . Hypertension   . Arthritis   . COPD (chronic obstructive pulmonary disease)   . Asthma    Past Surgical History  Procedure Laterality Date  . Replacement total knee     History reviewed. No pertinent family history. History  Substance Use Topics  . Smoking status: Current Some Day Smoker  . Smokeless tobacco: Not on file  . Alcohol Use: Yes    Review of Systems  Constitutional: Negative for fever.  HENT: Negative for sore throat.   Respiratory: Positive for cough (mild), shortness of breath and wheezing.   Cardiovascular: Negative for chest pain.  Gastrointestinal: Negative for vomiting.  All other systems reviewed and are negative.     Allergies  Review of patient's allergies indicates no known allergies.  Home Medications   Prior to Admission medications   Medication Sig Start Date End Date Taking? Authorizing Provider  albuterol (PROVENTIL HFA;VENTOLIN HFA) 108 (90 BASE) MCG/ACT inhaler Inhale 2 puffs into the lungs every 6 (six) hours as needed for wheezing. For shortness of breath and cough 03/07/12  Yes Nathan R.  Pickering, MD  budesonide-formoterol (SYMBICORT) 160-4.5 MCG/ACT inhaler Inhale 2 puffs into the lungs 2 (two) times daily.   Yes Historical Provider, MD  fluticasone (FLONASE) 50 MCG/ACT nasal spray Place 2 sprays into the nose at bedtime.     Yes Historical Provider, MD  ibuprofen (ADVIL,MOTRIN) 600 MG tablet Take 600 mg by mouth every 6 (six) hours as needed (for pain).   Yes Historical Provider, MD  montelukast (SINGULAIR) 10 MG tablet Take 1 tablet (10 mg total) by mouth at bedtime. 03/07/12  Yes Nathan R. Pickering, MD  oxyCODONE-acetaminophen (PERCOCET) 10-325 MG per tablet Take 2 tablets by mouth every 6 (six) hours as needed for pain.   Yes Historical Provider, MD  predniSONE (DELTASONE) 5 MG tablet Take 1 tablet (5 mg total) by mouth daily. 04/24/12  Yes Tatyana A Kirichenko, PA-C  tiotropium (SPIRIVA HANDIHALER) 18 MCG inhalation capsule Place 1 capsule (18 mcg total) into inhaler and inhale every morning. 10/15/13  Yes Rolland PorterMark James, MD  valsartan-hydrochlorothiazide (DIOVAN-HCT) 320-25 MG per tablet Take 1 tablet by mouth daily.   Yes Historical Provider, MD  predniSONE (STERAPRED UNI-PAK) 10 MG tablet Take 6 tabs by mouth daily  for 2 days, then 5 tabs for 2 days, then 4 tabs for 2 days, then 3 tabs for 2 days, 2 tabs for 2 days, then 1 tab by mouth daily for 2 days 01/06/14   Linwood DibblesJon Knapp, MD   BP 133/91  Pulse 42  Temp(Src) 98.2 F (36.8 C) (Oral)  Resp 21  SpO2 100% Physical Exam  Nursing note and vitals reviewed. Constitutional: He appears well-developed and well-nourished. No distress.  HENT:  Head: Normocephalic and atraumatic.  Right Ear: External ear normal.  Left Ear: External ear normal.  Eyes: Conjunctivae are normal. Right eye exhibits no discharge. Left eye exhibits no discharge. No scleral icterus.  Neck: Neck supple. No tracheal deviation present.  Cardiovascular: Normal rate, regular rhythm and intact distal pulses.   Pulmonary/Chest: Effort normal. No stridor. No  respiratory distress. He has wheezes. He has no rales.  Abdominal: Soft. Bowel sounds are normal. He exhibits no distension. There is no tenderness. There is no rebound and no guarding.  Musculoskeletal: He exhibits no edema and no tenderness.  Neurological: He is alert. He has normal strength. No cranial nerve deficit (no facial droop, extraocular movements intact, no slurred speech) or sensory deficit. He exhibits normal muscle tone. He displays no seizure activity. Coordination normal.  Skin: Skin is warm and dry. No rash noted.  Psychiatric: He has a normal mood and affect.    ED Course  Procedures (including critical care time) Labs Review Labs Reviewed  BASIC METABOLIC PANEL - Abnormal; Notable for the following:    Sodium 129 (*)    Chloride 93 (*)    Glucose, Bld 100 (*)    All other components within normal limits  CBC     EKG Interpretation   Date/Time:  Tuesday January 06 2014 12:04:30 EDT Ventricular Rate:  95 PR Interval:  134 QRS Duration: 90 QT Interval:  348 QTC Calculation: 437 R Axis:   89 Text Interpretation:  Normal sinus rhythm Right atrial enlargement Minimal  voltage criteria for LVH, may be normal variant ST and T wave abnormality  Abnormal ECG No significant change since last tracing Confirmed by KNAPP   MD-J, JON ([id number]) on 01/06/2014 12:10:28 PM     Medications  albuterol (PROVENTIL HFA;VENTOLIN HFA) 108 (90 BASE) MCG/ACT inhaler 1-2 puff (not administered)  albuterol (PROVENTIL,VENTOLIN) solution continuous neb (10 mg/hr Nebulization Given 01/06/14 1235)  predniSONE (DELTASONE) tablet 60 mg (60 mg Oral Given 01/06/14 1231)    MDM   Final diagnoses:  COPD exacerbation    Pt feels much better after treatment.  Repeat exam with a very slight wheeze on expiration.  Pt feels ready to go home.  Will dc home with steroid rx.   Follow up with PCP.      Linwood DibblesJon Knapp, MD 01/06/14 [phone]

## 2014-01-06 NOTE — ED Notes (Addendum)
He states hes had tightness in his chest and SOB since he ran out of his symbicort last night. He tried his rescue inhaler without relief. Breathing is labored while talking

## 2014-01-06 NOTE — ED Notes (Signed)
Md Knapp at bedside.  

## 2014-01-06 NOTE — Discharge Instructions (Signed)
Chronic Obstructive Pulmonary Disease Chronic obstructive pulmonary disease (COPD) is a common lung condition in which airflow from the lungs is limited. COPD is a general term that can be used to describe many different lung problems that limit airflow, including both chronic bronchitis and emphysema. If you have COPD, your lung function will probably never return to normal, but there are measures you can take to improve lung function and make yourself feel better.  CAUSES   Smoking (common).   Exposure to secondhand smoke.   Genetic problems.  Chronic inflammatory lung diseases or recurrent infections. SYMPTOMS   Shortness of breath, especially with physical activity.   Deep, persistent (chronic) cough with a large amount of thick mucus.   Wheezing.   Rapid breaths (tachypnea).   Gray or bluish discoloration (cyanosis) of the skin, especially in fingers, toes, or lips.   Fatigue.   Weight loss.   Frequent infections or episodes when breathing symptoms become much worse (exacerbations).   Chest tightness. DIAGNOSIS  Your healthcare provider will take a medical history and perform a physical examination to make the initial diagnosis. Additional tests for COPD may include:   Lung (pulmonary) function tests.  Chest X-ray.  CT scan.  Blood tests. TREATMENT  Treatment available to help you feel better when you have COPD include:   Inhaler and nebulizer medicines. These help manage the symptoms of COPD and make your breathing more comfortable  Supplemental oxygen. Supplemental oxygen is only helpful if you have a low oxygen level in your blood.   Exercise and physical activity. These are beneficial for nearly all people with COPD. Some people may also benefit from a pulmonary rehabilitation program. HOME CARE INSTRUCTIONS   Take all medicines (inhaled or pills) as directed by your health care provider.  Only take over-the-counter or prescription medicines  for pain, fever, or discomfort as directed by your health care provider.   Avoid over-the-counter medicines or cough syrups that dry up your airway (such as antihistamines) and slow down the elimination of secretions unless instructed otherwise by your healthcare provider.   If you are a smoker, the most important thing that you can do is stop smoking. Continuing to smoke will cause further lung damage and breathing trouble. Ask your health care provider for help with quitting smoking. He or she can direct you to community resources or hospitals that provide support.  Avoid exposure to irritants such as smoke, chemicals, and fumes that aggravate your breathing.  Use oxygen therapy and pulmonary rehabilitation if directed by your health care provider. If you require home oxygen therapy, ask your healthcare provider whether you should purchase a pulse oximeter to measure your oxygen level at home.   Avoid contact with individuals who have a contagious illness.  Avoid extreme temperature and humidity changes.  Eat healthy foods. Eating smaller, more frequent meals and resting before meals may help you maintain your strength.  Stay active, but balance activity with periods of rest. Exercise and physical activity will help you maintain your ability to do things you want to do.  Preventing infection and hospitalization is very important when you have COPD. Make sure to receive all the vaccines your health care provider recommends, especially the pneumococcal and influenza vaccines. Ask your healthcare provider whether you need a pneumonia vaccine.  Learn and use relaxation techniques to manage stress.  Learn and use controlled breathing techniques as directed by your health care provider. Controlled breathing techniques include:   Pursed lip breathing. Start by breathing   in (inhaling) through your nose for 1 second. Then, purse your lips as if you were going to whistle and breathe out (exhale)  through the pursed lips for 2 seconds.   Diaphragmatic breathing. Start by putting one hand on your abdomen just above your waist. Inhale slowly through your nose. The hand on your abdomen should move out. Then purse your lips and exhale slowly. You should be able to feel the hand on your abdomen moving in as you exhale.   Learn and use controlled coughing to clear mucus from your lungs. Controlled coughing is a series of short, progressive coughs. The steps of controlled coughing are:  1. Lean your head slightly forward.  2. Breathe in deeply using diaphragmatic breathing.  3. Try to hold your breath for 3 seconds.  4. Keep your mouth slightly open while coughing twice.  5. Spit any mucus out into a tissue.  6. Rest and repeat the steps once or twice as needed. SEEK MEDICAL CARE IF:   You are coughing up more mucus than usual.   There is a change in the color or thickness of your mucus.   Your breathing is more labored than usual.   Your breathing is faster than usual.  SEEK IMMEDIATE MEDICAL CARE IF:   You have shortness of breath while you are resting.   You have shortness of breath that prevents you from:  Being able to talk.   Performing your usual physical activities.   You have chest pain lasting longer than 5 minutes.   Your skin color is more cyanotic than usual.  You measure low oxygen saturations for longer than 5 minutes with a pulse oximeter. MAKE SURE YOU:   Understand these instructions.  Will watch your condition.  Will get help right away if you are not doing well or get worse. Document Released: 04/12/2005 Document Revised: 04/23/2013 Document Reviewed: 02/27/2013 [employer] Patient Information 2015 [employer]. This information is not intended to replace advice given to you by your health care provider. Make sure you discuss any questions you have with your health care provider.  

## 2014-08-28 ENCOUNTER — Emergency Department (HOSPITAL_COMMUNITY)
Admission: EM | Admit: 2014-08-28 | Discharge: 2014-08-28 | Disposition: A | Discharge status: Home / Self Care | Payer: Medicare Other | Attending: Emergency Medicine | Admitting: Emergency Medicine

## 2014-08-28 ENCOUNTER — Emergency Department (HOSPITAL_COMMUNITY): Payer: Medicare Other

## 2014-08-28 ENCOUNTER — Encounter (HOSPITAL_COMMUNITY): Payer: Self-pay | Admitting: *Deleted

## 2014-08-28 DIAGNOSIS — Z7951 Long term (current) use of inhaled steroids: Secondary | ICD-10-CM | POA: Diagnosis not present

## 2014-08-28 DIAGNOSIS — Z79899 Other long term (current) drug therapy: Secondary | ICD-10-CM | POA: Insufficient documentation

## 2014-08-28 DIAGNOSIS — I10 Essential (primary) hypertension: Secondary | ICD-10-CM | POA: Diagnosis not present

## 2014-08-28 DIAGNOSIS — J441 Chronic obstructive pulmonary disease with (acute) exacerbation: Secondary | ICD-10-CM

## 2014-08-28 DIAGNOSIS — R0602 Shortness of breath: Secondary | ICD-10-CM | POA: Diagnosis present

## 2014-08-28 DIAGNOSIS — M1990 Unspecified osteoarthritis, unspecified site: Secondary | ICD-10-CM | POA: Insufficient documentation

## 2014-08-28 DIAGNOSIS — Z720 Tobacco use: Secondary | ICD-10-CM | POA: Insufficient documentation

## 2014-08-28 LAB — CBC
HCT: 38.6 % — ABNORMAL LOW (ref 39.0–52.0)
Hemoglobin: 13 g/dL (ref 13.0–17.0)
MCH: 29 pg (ref 26.0–34.0)
MCHC: 33.7 g/dL (ref 30.0–36.0)
MCV: 86 fL (ref 78.0–100.0)
Platelets: 440 10*3/uL — ABNORMAL HIGH (ref 150–400)
RBC: 4.49 MIL/uL (ref 4.22–5.81)
RDW: 14.2 % (ref 11.5–15.5)
WBC: 6.3 10*3/uL (ref 4.0–10.5)

## 2014-08-28 LAB — TROPONIN I: Troponin I: <0.03 ng/mL (ref <0.031)

## 2014-08-28 LAB — BASIC METABOLIC PANEL
ANION GAP: 8 (ref 5–15)
BUN: 6 mg/dL (ref 6–23)
CALCIUM: 8.5 mg/dL (ref 8.4–10.5)
CO2: 24 mmol/L (ref 19–32)
Chloride: 103 mmol/L (ref 96–112)
Creatinine, Ser: 0.77 mg/dL (ref 0.50–1.35)
Glucose, Bld: 151 mg/dL — ABNORMAL HIGH (ref 70–99)
Potassium: 4 mmol/L (ref 3.5–5.1)
SODIUM: 135 mmol/L (ref 135–145)

## 2014-08-28 LAB — BRAIN NATRIURETIC PEPTIDE: B Natriuretic Peptide: 48 pg/mL (ref 0.0–100.0)

## 2014-08-28 MED ORDER — HYDROCODONE-ACETAMINOPHEN 5-325 MG PO TABS
1.0000 | ORAL_TABLET | Freq: Once | ORAL | Status: AC
  Administered 2014-08-28: 1 via ORAL
  Filled 2014-08-28: qty 1

## 2014-08-28 MED ORDER — ALBUTEROL (5 MG/ML) CONTINUOUS INHALATION SOLN
10.0000 mg/h | INHALATION_SOLUTION | Freq: Once | RESPIRATORY_TRACT | Status: AC
  Administered 2014-08-28: 10 mg/h via RESPIRATORY_TRACT
  Filled 2014-08-28: qty 20

## 2014-08-28 MED ORDER — HYDROCODONE-ACETAMINOPHEN 5-325 MG PO TABS: 2.0000 | ORAL_TABLET | ORAL | Status: DC | PRN

## 2014-08-28 MED ORDER — PREDNISONE 20 MG PO TABS
60.0000 mg | ORAL_TABLET | Freq: Once | ORAL | Status: AC
  Administered 2014-08-28: 60 mg via ORAL
  Filled 2014-08-28: qty 3

## 2014-08-28 MED ORDER — PREDNISONE 20 MG PO TABS
20.0000 mg | ORAL_TABLET | Freq: Every day | ORAL | Status: DC
Start: 2014-08-28 — End: 2015-05-28

## 2014-08-28 NOTE — ED Notes (Signed)
More sob since Sunday.  exertiional sob.  Hx of asthma.  He ran out of his prednisone one week ago.  He has hhn machines and inhalers. No pain

## 2014-08-28 NOTE — ED Notes (Signed)
Resp therapy notified about the hour long neb

## 2014-08-28 NOTE — Discharge Instructions (Signed)

## 2014-08-28 NOTE — ED Provider Notes (Signed)
CSN: [id number]     Arrival date & time 08/28/14  1828 History   First MD Initiated Contact with Patient 08/28/14 1954     Chief Complaint  Patient presents with  . Shortness of Breath      HPI  She presents for evaluation of difficulty breathing. History of COPD. On 10 mg a daily prednisone. Has been out for about a week. Typically flares up when he is out. Wheezing. Cough. Dry nonproductive. Presents here for evaluation. No chest pain. No fevers chills or other symptoms.  Past Medical History  Diagnosis Date  . Hypertension   . Arthritis   . COPD (chronic obstructive pulmonary disease)   . Asthma    Past Surgical History  Procedure Laterality Date  . Replacement total knee     No family history on file. History  Substance Use Topics  . Smoking status: Current Some Day Smoker  . Smokeless tobacco: Not on file  . Alcohol Use: Yes    Review of Systems  Constitutional: Negative for fever, chills, diaphoresis, appetite change and fatigue.  HENT: Negative for mouth sores, sore throat and trouble swallowing.   Eyes: Negative for visual disturbance.  Respiratory: Positive for shortness of breath and wheezing. Negative for cough and chest tightness.   Cardiovascular: Negative for chest pain.  Gastrointestinal: Negative for nausea, vomiting, abdominal pain, diarrhea and abdominal distention.  Endocrine: Negative for polydipsia, polyphagia and polyuria.  Genitourinary: Negative for dysuria, frequency and hematuria.  Musculoskeletal: Negative for gait problem.  Skin: Negative for color change, pallor and rash.  Neurological: Negative for dizziness, syncope, light-headedness and headaches.  Hematological: Does not bruise/bleed easily.  Psychiatric/Behavioral: Negative for behavioral problems and confusion.      Allergies  Review of patient's allergies indicates no known allergies.  Home Medications   Prior to Admission medications   Medication Sig Start Date End Date  Taking? Authorizing Provider  albuterol (PROVENTIL HFA;VENTOLIN HFA) 108 (90 BASE) MCG/ACT inhaler Inhale 2 puffs into the lungs every 6 (six) hours as needed for wheezing. For shortness of breath and cough 03/07/12  Yes Nathan R. Pickering, MD  budesonide-formoterol (SYMBICORT) 160-4.5 MCG/ACT inhaler Inhale 2 puffs into the lungs 2 (two) times daily.   Yes Historical Provider, MD  fluticasone (FLONASE) 50 MCG/ACT nasal spray Place 2 sprays into the nose at bedtime.     Yes Historical Provider, MD  ibuprofen (ADVIL,MOTRIN) 600 MG tablet Take 600 mg by mouth every 6 (six) hours as needed (for pain).   Yes Historical Provider, MD  lisinopril (PRINIVIL,ZESTRIL) 10 MG tablet Take 10 mg by mouth daily. 08/25/14  Yes Historical Provider, MD  montelukast (SINGULAIR) 10 MG tablet Take 1 tablet (10 mg total) by mouth at bedtime. 03/07/12  Yes Nathan R. Pickering, MD  oxyCODONE-acetaminophen (PERCOCET) 10-325 MG per tablet Take 2 tablets by mouth every 6 (six) hours as needed for pain.   Yes Historical Provider, MD  tiotropium (SPIRIVA HANDIHALER) 18 MCG inhalation capsule Place 1 capsule (18 mcg total) into inhaler and inhale every morning. 10/15/13  Yes Rolland PorterMark Chastin Garlitz, MD  valsartan-hydrochlorothiazide (DIOVAN-HCT) 320-25 MG per tablet Take 1 tablet by mouth daily.   Yes Historical Provider, MD  HYDROcodone-acetaminophen (NORCO/VICODIN) 5-325 MG per tablet Take 2 tablets by mouth every 4 (four) hours as needed. 08/28/14   Rolland PorterMark Latangela Mccomas, MD  predniSONE (DELTASONE) 20 MG tablet Take 1 tablet (20 mg total) by mouth daily with breakfast. 08/28/14   Rolland PorterMark Teofilo Lupinacci, MD   BP 186/122 mmHg  Pulse  96  Temp(Src) 97.5 F (36.4 C) (Oral)  Resp 18  SpO2 96% Physical Exam  Constitutional: He is oriented to person, place, and time. He appears well-developed and well-nourished. No distress.  HENT:  Head: Normocephalic.  Eyes: Conjunctivae are normal. Pupils are equal, round, and reactive to light. No scleral icterus.  Neck: Normal  range of motion. Neck supple. No thyromegaly present.  Cardiovascular: Normal rate and regular rhythm.  Exam reveals no gallop and no friction rub.   No murmur heard. Pulmonary/Chest: Effort normal. No respiratory distress. He has wheezes. He has no rales.  Wheezing and prolongation in all fields.  Abdominal: Soft. Bowel sounds are normal. He exhibits no distension. There is no tenderness. There is no rebound.  Musculoskeletal: Normal range of motion.  Neurological: He is alert and oriented to person, place, and time.  Skin: Skin is warm and dry. No rash noted.  Psychiatric: He has a normal mood and affect. His behavior is normal.    ED Course  Procedures (including critical care time) Labs Review Labs Reviewed  CBC - Abnormal; Notable for the following:    HCT 38.6 (*)    Platelets 440 (*)    All other components within normal limits  BASIC METABOLIC PANEL - Abnormal; Notable for the following:    Glucose, Bld 151 (*)    All other components within normal limits  BRAIN NATRIURETIC PEPTIDE  TROPONIN I    Imaging Review Dg Chest 2 View  08/28/2014   CLINICAL DATA:  Shortness of breath.  History of COPD and asthma.  EXAM: CHEST  2 VIEW  COMPARISON:  Chest radiograph 10/15/2013.  FINDINGS: Stable cardiac and mediastinal contours. Re- demonstrated left-greater-than-right biapical fibrosis and scarring with upward traction on the hila. The lungs are hyperinflated. No new superimposed consolidative pulmonary opacity. No pleural effusion or pneumothorax. Regional skeleton is unremarkable.  IMPRESSION: Stable appearance of the lungs with marked left-greater-than-right apical scarring and fibrosis.   Electronically Signed   By: Annia Belt M.D.   On: 08/28/2014 19:57     EKG Interpretation None      CRITICAL CARE Performed by: [name] JOSEPH   Total critical care time: 60 minutes  Start 20:20 End: 21:20 One hour continuous albuterol neb Critical care time was exclusive of  separately billable procedures and treating other patients.  Critical care was necessary to treat or prevent imminent or life-threatening deterioration.  Critical care was time spent personally by me on the following activities: development of treatment plan with patient and/or surrogate as well as nursing, discussions with consultants, evaluation of patient's response to treatment, examination of patient, obtaining history from patient or surrogate, ordering and performing treatments and interventions, ordering and review of laboratory studies, ordering and review of radiographic studies, pulse oximetry and re-evaluation of patient's condition.   MDM   Final diagnoses:  Chronic obstructive pulmonary disease with acute exacerbation   Patient much improved after nebulized albuterol and by mouth prednisone. Lanoxin 8. Ambulatory. Provider for home treatment. Has albuterol at home. Recently on prednisone.    [name], MD 08/29/14 [phone]

## 2015-05-27 ENCOUNTER — Encounter (HOSPITAL_COMMUNITY): Payer: Self-pay | Admitting: Emergency Medicine

## 2015-05-27 ENCOUNTER — Emergency Department (HOSPITAL_COMMUNITY): Payer: Medicare Other

## 2015-05-27 ENCOUNTER — Inpatient Hospital Stay (HOSPITAL_COMMUNITY)
Admission: EM | Admit: 2015-05-27 | Discharge: 2015-05-29 | DRG: 189 | Disposition: A | Discharge status: Home / Self Care | Payer: Medicare Other | Attending: Internal Medicine | Admitting: Internal Medicine

## 2015-05-27 DIAGNOSIS — M1990 Unspecified osteoarthritis, unspecified site: Secondary | ICD-10-CM | POA: Diagnosis present

## 2015-05-27 DIAGNOSIS — J441 Chronic obstructive pulmonary disease with (acute) exacerbation: Secondary | ICD-10-CM | POA: Diagnosis present

## 2015-05-27 DIAGNOSIS — J9602 Acute respiratory failure with hypercapnia: Secondary | ICD-10-CM | POA: Diagnosis not present

## 2015-05-27 DIAGNOSIS — J9601 Acute respiratory failure with hypoxia: Secondary | ICD-10-CM | POA: Diagnosis present

## 2015-05-27 DIAGNOSIS — J45909 Unspecified asthma, uncomplicated: Secondary | ICD-10-CM | POA: Diagnosis present

## 2015-05-27 DIAGNOSIS — J9690 Respiratory failure, unspecified, unspecified whether with hypoxia or hypercapnia: Secondary | ICD-10-CM

## 2015-05-27 DIAGNOSIS — Z7952 Long term (current) use of systemic steroids: Secondary | ICD-10-CM

## 2015-05-27 DIAGNOSIS — Z8249 Family history of ischemic heart disease and other diseases of the circulatory system: Secondary | ICD-10-CM

## 2015-05-27 DIAGNOSIS — Z789 Other specified health status: Secondary | ICD-10-CM

## 2015-05-27 DIAGNOSIS — I10 Essential (primary) hypertension: Secondary | ICD-10-CM | POA: Diagnosis present

## 2015-05-27 DIAGNOSIS — E872 Acidosis: Secondary | ICD-10-CM | POA: Diagnosis present

## 2015-05-27 DIAGNOSIS — J209 Acute bronchitis, unspecified: Secondary | ICD-10-CM | POA: Diagnosis present

## 2015-05-27 DIAGNOSIS — Z87891 Personal history of nicotine dependence: Secondary | ICD-10-CM

## 2015-05-27 DIAGNOSIS — J440 Chronic obstructive pulmonary disease with acute lower respiratory infection: Secondary | ICD-10-CM | POA: Diagnosis present

## 2015-05-27 MED ORDER — IPRATROPIUM BROMIDE 0.02 % IN SOLN
0.5000 mg | Freq: Once | RESPIRATORY_TRACT | Status: AC
Start: 2015-05-27 — End: 2015-05-27
  Administered 2015-05-27: 0.5 mg via RESPIRATORY_TRACT
  Filled 2015-05-27: qty 2.5

## 2015-05-27 MED ORDER — ALBUTEROL (5 MG/ML) CONTINUOUS INHALATION SOLN
15.0000 mg/h | INHALATION_SOLUTION | Freq: Once | RESPIRATORY_TRACT | Status: AC
  Administered 2015-05-27 – 2015-05-28 (×2): 15 mg/h via RESPIRATORY_TRACT
  Filled 2015-05-27: qty 20

## 2015-05-27 MED ORDER — IPRATROPIUM BROMIDE 0.02 % IN SOLN
RESPIRATORY_TRACT | Status: AC
  Administered 2015-05-28: 0.5 mg
  Filled 2015-05-27: qty 2.5

## 2015-05-27 MED ORDER — MAGNESIUM SULFATE 2 GM/50ML IV SOLN
2.0000 g | Freq: Once | INTRAVENOUS | Status: DC
  Filled 2015-05-27: qty 50

## 2015-05-27 NOTE — ED Provider Notes (Signed)
CSN: [id number]     Arrival date & time 05/27/15  2335 History  By signing my name below, I, [name], attest that this documentation has been prepared under the direction and in the presence of [name], MD. Electronically Signed: [name], ED Scribe. 05/27/2015. 11:47 PM.  Chief Complaint  Patient presents with  . Respiratory Distress   LEVEL 5 CAVEAT for severe respiratory distress  The history is provided by the EMS personnel and the patient. No language interpreter was used.     HPI Comments: [name] is a 54 y.o. male brought in by ambulance, with hx COPD and asthma who presents to the Emergency Department complaining of sudden onset, constant, severe, respiratory distress that began tonight. Upon EMS arrival, pt's O2 saturation was 74% and his respiratory rate was 40.  Pt was placed on CPAP en route but did not tolerate it. He was then placed on a nebulizer and given 15 mg Albuterol and 1 mg Atrovent. He was also given 0.15 mg Epinephrine, 125 mg Solumedrol, and 2 mg Magnesium en route. Denies fever, cough, or any other associated symptoms. Pt is non smoker.   Past Medical History  Diagnosis Date  . Hypertension   . Arthritis   . COPD (chronic obstructive pulmonary disease) (HCC)   . Asthma    Past Surgical History  Procedure Laterality Date  . Replacement total knee     No family history on file. Social History  Substance Use Topics  . Smoking status: Current Some Day Smoker  . Smokeless tobacco: None  . Alcohol Use: Yes    Review of Systems  Unable to perform ROS: Severe respiratory distress   Allergies  Review of patient's allergies indicates no known allergies.  Home Medications   Prior to Admission medications   Medication Sig Start Date End Date Taking? Authorizing Provider  albuterol (PROVENTIL HFA;VENTOLIN HFA) 108 (90 BASE) MCG/ACT inhaler Inhale 2 puffs into the lungs every 6 (six) hours as needed for wheezing. For shortness of breath  and cough 03/07/12   [name], MD  budesonide-formoterol [facility]) 160-4.5 MCG/ACT inhaler Inhale 2 puffs into the lungs 2 (two) times daily.    Historical Provider, MD  fluticasone (FLONASE) 50 MCG/ACT nasal spray Place 2 sprays into the nose at bedtime.      Historical Provider, MD  HYDROcodone-acetaminophen (NORCO/VICODIN) 5-325 MG per tablet Take 2 tablets by mouth every 4 (four) hours as needed. 08/28/14   [name], MD  ibuprofen (ADVIL,MOTRIN) 600 MG tablet Take 600 mg by mouth every 6 (six) hours as needed (for pain).    Historical Provider, MD  lisinopril (PRINIVIL,ZESTRIL) 10 MG tablet Take 10 mg by mouth daily. 08/25/14   Historical Provider, MD  montelukast (SINGULAIR) 10 MG tablet Take 1 tablet (10 mg total) by mouth at bedtime. 03/07/12   [name], MD  oxyCODONE-acetaminophen (PERCOCET) 10-325 MG per tablet Take 2 tablets by mouth every 6 (six) hours as needed for pain.    Historical Provider, MD  predniSONE (DELTASONE) 20 MG tablet Take 1 tablet (20 mg total) by mouth daily with breakfast. 08/28/14   [name], MD  tiotropium (SPIRIVA HANDIHALER) 18 MCG inhalation capsule Place 1 capsule (18 mcg total) into inhaler and inhale every morning. 10/15/13   [name], MD  valsartan-hydrochlorothiazide (DIOVAN-HCT) 320-25 MG per tablet Take 1 tablet by mouth daily.    Historical Provider, MD   Triage VItals: BP 194/145 mmHg  Pulse 140  Temp(Src) 98.2 F (36.8 C) (  Oral)  Resp 37  SpO2 99%   Physical Exam  Constitutional: He is oriented to person, place, and time. He appears well-developed and well-nourished. He appears distressed.  In moderate respiratory distress Using accessory muscles of respiration  HENT:  Head: Normocephalic and atraumatic.  Eyes: Conjunctivae and EOM are normal.  Neck: Normal range of motion. Neck supple. No JVD present.  Cardiovascular: Regular rhythm.  Tachycardia present.   No murmur heard. Pulmonary/Chest: He is in respiratory distress. He  has wheezes. He has no rales. He exhibits no tenderness.  Lungs have decreased air flow and diffuse inspiratory and expiratory wheezes  Abdominal: Soft. He exhibits no distension and no mass. There is no tenderness.  Musculoskeletal: Normal range of motion. He exhibits no edema.  Lymphadenopathy:    He has no cervical adenopathy.  Neurological: He is alert and oriented to person, place, and time. No cranial nerve deficit. He exhibits normal muscle tone. Coordination normal.  Anxious  Skin: Skin is warm and dry. No rash noted.  Nursing note and vitals reviewed.   ED Course  Procedures (including critical care time)  DIAGNOSTIC STUDIES: Oxygen Saturation is 99% on [place], normal by my interpretation.    COORDINATION OF CARE: 11:39 PM-Discussed treatment plan which includes CXR, Magnesium, ABG, Lactic acid, CBC, BMP, and Troponin with pt at bedside and pt agreed to plan.   Labs Review Results for orders placed or performed during the hospital encounter of 05/27/15  CBC with Differential  Result Value Ref Range   WBC 15.2 (H) 4.0 - 10.5 K/uL   RBC 5.08 4.22 - 5.81 MIL/uL   Hemoglobin 16.0 13.0 - 17.0 g/dL   HCT 16.1 09.6 - 04.5 %   MCV 91.1 78.0 - 100.0 fL   MCH 31.5 26.0 - 34.0 pg   MCHC 34.6 30.0 - 36.0 g/dL   RDW 40.9 81.1 - 91.4 %   Platelets 370 150 - 400 K/uL   Neutro Abs 7.9 (H) 1.7 - 7.7 K/uL   Lymphs Abs 4.1 (H) 0.7 - 4.0 K/uL   Monocytes Absolute 2.1 (H) 0.1 - 1.0 K/uL   Eosinophils Absolute 1.1 (H) 0.0 - 0.7 K/uL   Basophils Absolute 0.1 0.0 - 0.1 K/uL   Neutrophils Relative % 51 %   Lymphocytes Relative 27 %   Monocytes Relative 14 %   Eosinophils Relative 8 %   Basophils Relative 0 %   Band Neutrophils 0 %   Metamyelocytes Relative 0 %   Myelocytes 0 %   Promyelocytes Absolute 0 %   Blasts 0 %   nRBC 0 0 /100 WBC   Other 0 %   WBC Morphology ATYPICAL LYMPHOCYTES   Basic metabolic panel  Result Value Ref Range   Sodium 137 135 - 145 mmol/L   Potassium 3.7  3.5 - 5.1 mmol/L   Chloride 103 101 - 111 mmol/L   CO2 25 22 - 32 mmol/L   Glucose, Bld 130 (H) 65 - 99 mg/dL   BUN 8 6 - 20 mg/dL   Creatinine, Ser 7.82 0.61 - 1.24 mg/dL   Calcium 9.1 8.9 - 95.6 mg/dL   GFR calc non Af Amer >60 >60 mL/min   GFR calc Af Amer >60 >60 mL/min   Anion gap 9 5 - 15  Troponin I  Result Value Ref Range   Troponin I <0.03 <0.031 ng/mL  Magnesium  Result Value Ref Range   Magnesium 2.9 (H) 1.7 - 2.4 mg/dL  I-Stat CG4 Lactic Acid, ED  Result Value Ref Range   Lactic Acid, Venous 1.32 0.5 - 2.0 mmol/L  I-Stat arterial blood gas, ED  Result Value Ref Range   pH, Arterial 7.208 (L) 7.350 - 7.450   pCO2 arterial 73.2 (HH) 35.0 - 45.0 mmHg   pO2, Arterial 128.0 (H) 80.0 - 100.0 mmHg   Bicarbonate 29.1 (H) 20.0 - 24.0 mEq/L   TCO2 31 0 - 100 mmol/L   O2 Saturation 98.0 %   Acid-base deficit 1.0 0.0 - 2.0 mmol/L   Patient temperature HIDE    Collection site RADIAL, ALLEN'S TEST ACCEPTABLE    Drawn by RT    Sample type ARTERIAL    Comment NOTIFIED PHYSICIAN    Imaging Review Dg Chest Port 1 View  05/27/2015  CLINICAL DATA:  Shortness of breath EXAM: PORTABLE CHEST 1 VIEW COMPARISON:  08/28/2014 FINDINGS: Chronic lung disease with fibrosis and volume loss at the left apex. There is diffuse emphysematous change and hyperinflation. Normal heart size. Stable mediastinal contours with left hilar retraction. Bronchiectasis at the left apex. No acute superimposed finding. IMPRESSION: 1. No acute finding.  Baseline appearance of the chest. 2. Advanced emphysema. 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 Electronically Signed   By: Marnee Spring M.D.   On: 05/27/2015 23:55   I have personally reviewed and evaluated these images and lab results as part of my medical decision-making.   EKG Interpretation   Date/Time:  Thursday May 27 2015 23:44:16 EST Ventricular Rate:  140 PR Interval:  141 QRS Duration: 90 QT Interval:  314 QTC  Calculation: 479 R Axis:   87 Text Interpretation:  Sinus tachycardia Consider right atrial enlargement  Probable LVH with secondary repol abnrm Anterior Q waves, possibly due to  LVH ST depr, consider ischemia, inferior leads Minimal ST elevation,  lateral leads Artifact in lead(s) II III aVR aVL aVF V1 V2 V4 V5 V6  Repolarization changes secondary to LVH When compared with ECG of  08/28/2014, HEART RATE has increased Confirmed by [facility]  MD, Braxten Memmer ([id number])  on 05/28/2015 12:13:31 AM      CRITICAL CARE Performed by: UEAVW,[email or web address] Total critical care time: 100 minutes Critical care time was exclusive of separately billable procedures and treating other patients. Critical care was necessary to treat or prevent imminent or life-threatening deterioration. Critical care was time spent personally by me on the following activities: development of treatment plan with patient and/or surrogate as well as nursing, discussions with consultants, evaluation of patient's response to treatment, examination of patient, obtaining history from patient or surrogate, ordering and performing treatments and interventions, ordering and review of laboratory studies, ordering and review of radiographic studies, pulse oximetry and re-evaluation of patient's condition. MDM   Final diagnoses:  COPD exacerbation (HCC)  Acute respiratory failure with hypoxia and hypercarbia (HCC)  DNI (do not intubate)    COPD exacerbation. Patient presented in respiratory distress with use of accessory muscles. However, he is awake wake and conversant. He refused to accept BiPAP and would pull the mask off. He was even not tolerating the mask for continuous albuterol nebulizer. He already received her breathing treatments and methylprednisolone and magnesium in the ambulance prior to arrival in the ED. Blood gas obtained did show significant hypercarbia with respiratory acidosis. I've explained to the patient that he might need intubation  based on this finding. Patient stated that he did not wish to be intubated and did understand that he might die without intubation. He was given a low-dose of lorazepam and  following this, he was tolerating the BiPAP. He was given a 1 hour continuous nebulizer treatment was slight to moderate improvement. He was given no longer using all his accessory muscles and he continued to be awake and alert. Case was discussed with Dr. Toniann FailKakrakandy of [facility] who was concerned that the patient may need to go to ICU. I discussed case with Dr. Tyson AliasFeinstein of critical care who felt that patient could be managed at stepdown unit so long as he was DO NOT INTUBATE status. Case was discussed again with Dr. Toniann FailKakrakandy who agrees to admit the patient to stepdown unit. Old records are reviewed and he has had hospitalizations for COPD exacerbation, but none recently and none requiring intubation.   I personally performed the services described in this documentation, which was scribed in my presence. The recorded information has been reviewed and is accurate.       Dione Boozeavid Meilani Edmundson, MD 05/28/15 [phone]

## 2015-05-27 NOTE — ED Notes (Signed)
Patient with increased shortness of breath for one day.  Patient has history of COPD and Asthma.  EMS found patient with 74% O2 saturation initially.  Patient was sitting in tripod position, using accessory muscles, breathing 40 times a minute.  Patient was initially placed on CPAP and did not tolerate.  Patient was placed on nebulizer and received a total of 15mg  of albuterol and 1mg  of atrovent.  Patient was given 0.15mg  epi IM, 125mg  solumedrol, 2gm Magnesium.  EMS was assisting with ventilations and patient became combative.  Patient hypertensive.

## 2015-05-28 ENCOUNTER — Inpatient Hospital Stay (HOSPITAL_COMMUNITY): Payer: Medicare Other

## 2015-05-28 ENCOUNTER — Encounter (HOSPITAL_COMMUNITY): Payer: Self-pay | Admitting: *Deleted

## 2015-05-28 DIAGNOSIS — J441 Chronic obstructive pulmonary disease with (acute) exacerbation: Secondary | ICD-10-CM | POA: Diagnosis present

## 2015-05-28 DIAGNOSIS — Z8249 Family history of ischemic heart disease and other diseases of the circulatory system: Secondary | ICD-10-CM | POA: Diagnosis not present

## 2015-05-28 DIAGNOSIS — I10 Essential (primary) hypertension: Secondary | ICD-10-CM | POA: Diagnosis present

## 2015-05-28 DIAGNOSIS — J9601 Acute respiratory failure with hypoxia: Secondary | ICD-10-CM

## 2015-05-28 DIAGNOSIS — J45909 Unspecified asthma, uncomplicated: Secondary | ICD-10-CM | POA: Diagnosis present

## 2015-05-28 DIAGNOSIS — J440 Chronic obstructive pulmonary disease with acute lower respiratory infection: Secondary | ICD-10-CM | POA: Diagnosis present

## 2015-05-28 DIAGNOSIS — M1990 Unspecified osteoarthritis, unspecified site: Secondary | ICD-10-CM | POA: Diagnosis present

## 2015-05-28 DIAGNOSIS — Z7952 Long term (current) use of systemic steroids: Secondary | ICD-10-CM | POA: Diagnosis not present

## 2015-05-28 DIAGNOSIS — J209 Acute bronchitis, unspecified: Secondary | ICD-10-CM

## 2015-05-28 DIAGNOSIS — J9602 Acute respiratory failure with hypercapnia: Secondary | ICD-10-CM | POA: Diagnosis present

## 2015-05-28 DIAGNOSIS — Z87891 Personal history of nicotine dependence: Secondary | ICD-10-CM | POA: Diagnosis not present

## 2015-05-28 DIAGNOSIS — E872 Acidosis: Secondary | ICD-10-CM | POA: Diagnosis present

## 2015-05-28 LAB — MAGNESIUM: MAGNESIUM: 2.9 mg/dL — AB (ref 1.7–2.4)

## 2015-05-28 LAB — CBC WITH DIFFERENTIAL/PLATELET
BASOS ABS: 0 10*3/uL (ref 0.0–0.1)
BASOS PCT: 0 %
BASOS PCT: 0 %
BLASTS: 0 %
Band Neutrophils: 0 %
Basophils Absolute: 0.1 10*3/uL (ref 0.0–0.1)
EOS ABS: 0 10*3/uL (ref 0.0–0.7)
EOS PCT: 8 %
Eosinophils Absolute: 1.1 10*3/uL — ABNORMAL HIGH (ref 0.0–0.7)
Eosinophils Relative: 0 %
HEMATOCRIT: 43.9 % (ref 39.0–52.0)
HEMATOCRIT: 46.3 % (ref 39.0–52.0)
HEMOGLOBIN: 14.9 g/dL (ref 13.0–17.0)
HEMOGLOBIN: 16 g/dL (ref 13.0–17.0)
LYMPHS ABS: 4.1 10*3/uL — AB (ref 0.7–4.0)
Lymphocytes Relative: 27 %
Lymphocytes Relative: 3 %
Lymphs Abs: 0.4 10*3/uL — ABNORMAL LOW (ref 0.7–4.0)
MCH: 30.7 pg (ref 26.0–34.0)
MCH: 31.5 pg (ref 26.0–34.0)
MCHC: 33.9 g/dL (ref 30.0–36.0)
MCHC: 34.6 g/dL (ref 30.0–36.0)
MCV: 90.5 fL (ref 78.0–100.0)
MCV: 91.1 fL (ref 78.0–100.0)
METAMYELOCYTES PCT: 0 %
MONOS PCT: 1 %
MONOS PCT: 14 %
Monocytes Absolute: 0.1 10*3/uL (ref 0.1–1.0)
Monocytes Absolute: 2.1 10*3/uL — ABNORMAL HIGH (ref 0.1–1.0)
Myelocytes: 0 %
NEUTROS ABS: 12.6 10*3/uL — AB (ref 1.7–7.7)
NEUTROS ABS: 7.9 10*3/uL — AB (ref 1.7–7.7)
NEUTROS PCT: 51 %
NEUTROS PCT: 96 %
NRBC: 0 /100{WBCs}
Other: 0 %
Platelets: 337 10*3/uL (ref 150–400)
Platelets: 370 10*3/uL (ref 150–400)
Promyelocytes Absolute: 0 %
RBC: 4.85 MIL/uL (ref 4.22–5.81)
RBC: 5.08 MIL/uL (ref 4.22–5.81)
RDW: 13.9 % (ref 11.5–15.5)
RDW: 14 % (ref 11.5–15.5)
WBC: 13.1 10*3/uL — ABNORMAL HIGH (ref 4.0–10.5)
WBC: 15.2 10*3/uL — ABNORMAL HIGH (ref 4.0–10.5)

## 2015-05-28 LAB — COMPREHENSIVE METABOLIC PANEL
ALK PHOS: 60 U/L (ref 38–126)
ALT: 11 U/L — AB (ref 17–63)
ANION GAP: 15 (ref 5–15)
AST: 20 U/L (ref 15–41)
Albumin: 3.8 g/dL (ref 3.5–5.0)
BILIRUBIN TOTAL: 1 mg/dL (ref 0.3–1.2)
BUN: 15 mg/dL (ref 6–20)
CALCIUM: 9 mg/dL (ref 8.9–10.3)
CO2: 20 mmol/L — ABNORMAL LOW (ref 22–32)
CREATININE: 0.98 mg/dL (ref 0.61–1.24)
Chloride: 99 mmol/L — ABNORMAL LOW (ref 101–111)
Glucose, Bld: 131 mg/dL — ABNORMAL HIGH (ref 65–99)
Potassium: 4 mmol/L (ref 3.5–5.1)
Sodium: 134 mmol/L — ABNORMAL LOW (ref 135–145)
TOTAL PROTEIN: 7.4 g/dL (ref 6.5–8.1)

## 2015-05-28 LAB — GLUCOSE, CAPILLARY
GLUCOSE-CAPILLARY: 162 mg/dL — AB (ref 65–99)
GLUCOSE-CAPILLARY: 211 mg/dL — AB (ref 65–99)
GLUCOSE-CAPILLARY: 143 mg/dL — AB (ref 65–99)
Glucose-Capillary: 132 mg/dL — ABNORMAL HIGH (ref 65–99)

## 2015-05-28 LAB — BLOOD GAS, ARTERIAL
Acid-base deficit: 3.4 mmol/L — ABNORMAL HIGH (ref 0.0–2.0)
BICARBONATE: 21.4 meq/L (ref 20.0–24.0)
DRAWN BY: 44956
Delivery systems: POSITIVE
Expiratory PAP: 5
FIO2: 0.4
Inspiratory PAP: 14
LHR: 10 {breaths}/min
MODE: POSITIVE
O2 SAT: 99.1 %
PH ART: 7.355 (ref 7.350–7.450)
Patient temperature: 97.4
TCO2: 22.6 mmol/L (ref 0–100)
pCO2 arterial: 38.9 mmHg (ref 35.0–45.0)
pO2, Arterial: 151 mmHg — ABNORMAL HIGH (ref 80.0–100.0)

## 2015-05-28 LAB — I-STAT ARTERIAL BLOOD GAS, ED
ACID-BASE DEFICIT: 1 mmol/L (ref 0.0–2.0)
BICARBONATE: 29.1 meq/L — AB (ref 20.0–24.0)
O2 Saturation: 98 %
PH ART: 7.208 — AB (ref 7.350–7.450)
TCO2: 31 mmol/L (ref 0–100)
pCO2 arterial: 73.2 mmHg (ref 35.0–45.0)
pO2, Arterial: 128 mmHg — ABNORMAL HIGH (ref 80.0–100.0)

## 2015-05-28 LAB — BASIC METABOLIC PANEL
ANION GAP: 9 (ref 5–15)
BUN: 8 mg/dL (ref 6–20)
CHLORIDE: 103 mmol/L (ref 101–111)
CO2: 25 mmol/L (ref 22–32)
CREATININE: 0.83 mg/dL (ref 0.61–1.24)
Calcium: 9.1 mg/dL (ref 8.9–10.3)
GFR calc non Af Amer: >60 mL/min (ref >60)
Glucose, Bld: 130 mg/dL — ABNORMAL HIGH (ref 65–99)
POTASSIUM: 3.7 mmol/L (ref 3.5–5.1)
SODIUM: 137 mmol/L (ref 135–145)

## 2015-05-28 LAB — PROCALCITONIN: PROCALCITONIN: 1.19 ng/mL

## 2015-05-28 LAB — I-STAT CG4 LACTIC ACID, ED: Lactic Acid, Venous: 1.32 mmol/L (ref 0.5–2.0)

## 2015-05-28 LAB — TROPONIN I

## 2015-05-28 LAB — MRSA PCR SCREENING: MRSA BY PCR: NEGATIVE

## 2015-05-28 MED ORDER — IPRATROPIUM-ALBUTEROL 0.5-2.5 (3) MG/3ML IN SOLN
3.0000 mL | Freq: Four times a day (QID) | RESPIRATORY_TRACT | Status: DC
  Administered 2015-05-28: 3 mL via RESPIRATORY_TRACT
  Filled 2015-05-28: qty 3

## 2015-05-28 MED ORDER — ONDANSETRON HCL 4 MG/2ML IJ SOLN: 4.0000 mg | Freq: Four times a day (QID) | INTRAMUSCULAR | Status: DC | PRN

## 2015-05-28 MED ORDER — VALSARTAN-HYDROCHLOROTHIAZIDE 320-25 MG PO TABS: 1.0000 | ORAL_TABLET | Freq: Every day | ORAL | Status: DC

## 2015-05-28 MED ORDER — PNEUMOCOCCAL VAC POLYVALENT 25 MCG/0.5ML IJ INJ
0.5000 mL | INJECTION | INTRAMUSCULAR | Status: DC
  Filled 2015-05-28: qty 0.5

## 2015-05-28 MED ORDER — METHYLPREDNISOLONE SODIUM SUCC 40 MG IJ SOLR
40.0000 mg | Freq: Two times a day (BID) | INTRAMUSCULAR | Status: DC
  Administered 2015-05-28 – 2015-05-29 (×3): 40 mg via INTRAVENOUS
  Filled 2015-05-28 (×3): qty 1

## 2015-05-28 MED ORDER — ACETAMINOPHEN 325 MG PO TABS: 650.0000 mg | ORAL_TABLET | Freq: Four times a day (QID) | ORAL | Status: DC | PRN

## 2015-05-28 MED ORDER — ARFORMOTEROL TARTRATE 15 MCG/2ML IN NEBU
15.0000 ug | INHALATION_SOLUTION | Freq: Two times a day (BID) | RESPIRATORY_TRACT | Status: DC
  Administered 2015-05-28: 15 ug via RESPIRATORY_TRACT
  Filled 2015-05-28 (×3): qty 2

## 2015-05-28 MED ORDER — HYDROCODONE-ACETAMINOPHEN 5-325 MG PO TABS
2.0000 | ORAL_TABLET | ORAL | Status: DC | PRN
  Administered 2015-05-28: 2 via ORAL
  Filled 2015-05-28: qty 2

## 2015-05-28 MED ORDER — DEXTROSE 5 % IV SOLN
100.0000 mg | Freq: Two times a day (BID) | INTRAVENOUS | Status: DC
  Administered 2015-05-29: 100 mg via INTRAVENOUS
  Filled 2015-05-28 (×2): qty 100

## 2015-05-28 MED ORDER — IPRATROPIUM BROMIDE 0.02 % IN SOLN: 0.5000 mg | Freq: Four times a day (QID) | RESPIRATORY_TRACT | Status: DC

## 2015-05-28 MED ORDER — LORAZEPAM 2 MG/ML IJ SOLN
0.5000 mg | Freq: Once | INTRAMUSCULAR | Status: AC
Start: 2015-05-28 — End: 2015-05-28
  Administered 2015-05-28: 0.5 mg via INTRAVENOUS
  Filled 2015-05-28: qty 1

## 2015-05-28 MED ORDER — DEXTROSE 5 % IV SOLN
500.0000 mg | INTRAVENOUS | Status: DC
  Administered 2015-05-28: 500 mg via INTRAVENOUS
  Filled 2015-05-28: qty 500

## 2015-05-28 MED ORDER — SODIUM CHLORIDE 0.9 % IJ SOLN
3.0000 mL | Freq: Two times a day (BID) | INTRAMUSCULAR | Status: DC
  Administered 2015-05-28 (×2): 3 mL via INTRAVENOUS

## 2015-05-28 MED ORDER — IRBESARTAN 300 MG PO TABS
300.0000 mg | ORAL_TABLET | Freq: Every day | ORAL | Status: DC
  Administered 2015-05-28 – 2015-05-29 (×2): 300 mg via ORAL
  Filled 2015-05-28 (×2): qty 1

## 2015-05-28 MED ORDER — ALBUTEROL SULFATE (2.5 MG/3ML) 0.083% IN NEBU: 2.5000 mg | INHALATION_SOLUTION | RESPIRATORY_TRACT | Status: DC | PRN

## 2015-05-28 MED ORDER — ONDANSETRON HCL 4 MG PO TABS: 4.0000 mg | ORAL_TABLET | Freq: Four times a day (QID) | ORAL | Status: DC | PRN

## 2015-05-28 MED ORDER — BUDESONIDE 0.25 MG/2ML IN SUSP
0.5000 mg | Freq: Two times a day (BID) | RESPIRATORY_TRACT | Status: DC
  Administered 2015-05-28: 0.5 mg via RESPIRATORY_TRACT
  Filled 2015-05-28: qty 4

## 2015-05-28 MED ORDER — HYDROCHLOROTHIAZIDE 25 MG PO TABS
25.0000 mg | ORAL_TABLET | Freq: Every day | ORAL | Status: DC
Start: 2015-05-28 — End: 2015-05-29
  Administered 2015-05-28 – 2015-05-29 (×2): 25 mg via ORAL
  Filled 2015-05-28 (×2): qty 1

## 2015-05-28 MED ORDER — ALBUTEROL SULFATE (2.5 MG/3ML) 0.083% IN NEBU: 2.5000 mg | INHALATION_SOLUTION | RESPIRATORY_TRACT | Status: DC

## 2015-05-28 MED ORDER — ACETAMINOPHEN 650 MG RE SUPP: 650.0000 mg | Freq: Four times a day (QID) | RECTAL | Status: DC | PRN

## 2015-05-28 MED ORDER — LORAZEPAM 2 MG/ML IJ SOLN: 0.5000 mg | INTRAMUSCULAR | Status: DC | PRN

## 2015-05-28 MED ORDER — FLUTICASONE PROPIONATE 50 MCG/ACT NA SUSP
2.0000 | Freq: Every day | NASAL | Status: DC
  Administered 2015-05-28 (×2): 2 via NASAL
  Filled 2015-05-28: qty 16

## 2015-05-28 MED ORDER — ALBUTEROL SULFATE (2.5 MG/3ML) 0.083% IN NEBU
2.5000 mg | INHALATION_SOLUTION | RESPIRATORY_TRACT | Status: DC | PRN
  Administered 2015-05-29: 2.5 mg via RESPIRATORY_TRACT
  Filled 2015-05-28: qty 3

## 2015-05-28 MED ORDER — METHYLPREDNISOLONE SODIUM SUCC 40 MG IJ SOLR: 40.0000 mg | Freq: Three times a day (TID) | INTRAMUSCULAR | Status: DC

## 2015-05-28 MED ORDER — MONTELUKAST SODIUM 10 MG PO TABS
10.0000 mg | ORAL_TABLET | Freq: Every day | ORAL | Status: DC
  Administered 2015-05-28 (×2): 10 mg via ORAL
  Filled 2015-05-28 (×2): qty 1

## 2015-05-28 MED ORDER — ENOXAPARIN SODIUM 30 MG/0.3ML ~~LOC~~ SOLN
30.0000 mg | SUBCUTANEOUS | Status: DC
  Administered 2015-05-28 – 2015-05-29 (×2): 30 mg via SUBCUTANEOUS
  Filled 2015-05-28 (×2): qty 0.3

## 2015-05-28 MED ORDER — BUDESONIDE 0.25 MG/2ML IN SUSP: 0.2500 mg | Freq: Two times a day (BID) | RESPIRATORY_TRACT | Status: DC

## 2015-05-28 MED ORDER — IPRATROPIUM-ALBUTEROL 0.5-2.5 (3) MG/3ML IN SOLN
3.0000 mL | RESPIRATORY_TRACT | Status: DC
Start: 2015-05-28 — End: 2015-05-29
  Administered 2015-05-28 – 2015-05-29 (×6): 3 mL via RESPIRATORY_TRACT
  Filled 2015-05-28 (×5): qty 3

## 2015-05-28 NOTE — Progress Notes (Signed)
Utilization review completed. Ryer Asato, RN, BSN. 

## 2015-05-28 NOTE — ED Notes (Signed)
Attempted report 

## 2015-05-28 NOTE — Progress Notes (Signed)
[facility] PROGRESS NOTE  [name] ZOX:[id number] DOB: 1961/01/08 DOA: 05/27/2015 PCP: Dorrene German, MD  Assessment/Plan:  Acute Respiratory Failure with Hypercapnia  -Patient admitted 11/10 with complaints of worsening SOB, improved with BiPAP, patient is not requiring oxygen at this point. Tolerating this well without hypoxia, states he feels much better  -Resolving, repeat ABG at 0400 with pC02 38.9 and pH 7.35 (compared to 73.2 and 7.2 on admission)  -PCCM consulted with plan for DuoNebs, Budesonide, PRN Albuterol and solumedrol  -Plan for ambulatory desat testing tomorrow to determine need for oxygen at home  -Transfer to telemetry, likely will discharge tomorrow if he continues to do well   Bronchiectasis  -CXR 11/11 with extensive emphysematous changes and left apical bronchiectasis  -Plan to continue empiric coverage with Azithromycin   Hypertension  -BP elevated upon admission, stable now in the 120s -Continue Irbesartan/HCTZ   Code Status: Full Code Family Communication: No family present at bedside  Disposition Plan: TBD, likely home tomorrow  Consultants:  PCCM  Procedures:  None  Antibiotics:  Azithromycin    HPI/Subjective: [name] is a 54 y/o male with PMH of COPD, HTN and tobacco abuse who presented to the ED 11/10 with complaints of worsening SOB. He stated that he had run out of most of his COPD medications for several days and began feeling SOB Wednesday evening and this continued throughout the day. He has a non-productive cough, denies fever, chills. Upon arrival to the ED patient was found to be wheezing with acute respiratory failure and hypercapnia. He was placed on BiPAP and admitted for further evaluation.   Objective: Filed Vitals:   05/28/15 0826  BP:   Pulse:   Temp: 97.8 F (36.6 C)  Resp:     Intake/Output Summary (Last 24 hours) at 05/28/15 1026 Last data filed at 05/28/15 0700  Gross per 24 hour  Intake      0 ml   Output    450 ml  Net   -450 ml   Filed Weights   05/28/15 0347  Weight: 58.2 kg (128 lb 4.9 oz)    Exam:   General:  Pleasant male, sitting upright in bed, NAD  Cardiovascular: Tachycardic, regular rhythm, no murmurs  Respiratory: Expiratory and inspiratory wheezes but able to move air   Abdomen: Soft, non-tender, bowel sounds present all 4 quadrants  Musculoskeletal: 5/5 strength all four extremities.   Data Reviewed: Basic Metabolic Panel:  Recent Labs Lab 05/27/15 2352 05/28/15 0654  NA 137 134*  K 3.7 4.0  CL 103 99*  CO2 25 20*  GLUCOSE 130* 131*  BUN 8 15  CREATININE 0.83 0.98  CALCIUM 9.1 9.0  MG 2.9*  --    Liver Function Tests:  Recent Labs Lab 05/28/15 0654  AST 20  ALT 11*  ALKPHOS 60  BILITOT 1.0  PROT 7.4  ALBUMIN 3.8   No results for input(s): LIPASE, AMYLASE in the last 168 hours. No results for input(s): AMMONIA in the last 168 hours. CBC:  Recent Labs Lab 05/27/15 2352 05/28/15 0654  WBC 15.2* 13.1*  NEUTROABS 7.9* 12.6*  HGB 16.0 14.9  HCT 46.3 43.9  MCV 91.1 90.5  PLT 370 337   Cardiac Enzymes:  Recent Labs Lab 05/27/15 2352  TROPONINI <0.03   BNP (last 3 results)  Recent Labs  08/28/14 1834  BNP 48.0    ProBNP (last 3 results) No results for input(s): PROBNP in the last 8760 hours.  CBG:  Recent Labs Lab  05/28/15 0744  GLUCAP 162*    Recent Results (from the past 240 hour(s))  MRSA PCR Screening     Status: None   Collection Time: 05/28/15  3:50 AM  Result Value Ref Range Status   MRSA by PCR NEGATIVE NEGATIVE Final    Comment:        The GeneXpert MRSA Assay (FDA approved for NASAL specimens only), is one component of a comprehensive MRSA colonization surveillance program. It is not intended to diagnose MRSA infection nor to guide or monitor treatment for MRSA infections.      Studies: Dg Chest Port 1 View  05/28/2015  CLINICAL DATA:  Respiratory failure EXAM: PORTABLE CHEST 1 VIEW  COMPARISON:  May 27, 2015 and December 18, 2011 FINDINGS: There is extensive underlying emphysematous change. There is retraction on the left with areas of scarring and fibrotic change in the left upper lobe/apex regions, stable. There is bronchiectatic change in the left apex as well, stable. Interstitium is prominent in the right mid lower lung zones, largely due to redistribution of blood flow to a viable lung segments. There is scarring in the lateral right base region with blunting of the right costophrenic angle. There is no frank edema or consolidation. The heart size is within normal limits. The pulmonary vascularity reflects underlying emphysematous change is stable. No adenopathy apparent. IMPRESSION: No change from 1 day prior. Extensive emphysematous change with areas of fibrosis and scarring. There is left apical bronchiectatic change. No new opacity. No change in cardiac silhouette. Electronically Signed   By: Bretta Bang III M.D.   On: 05/28/2015 07:24   Dg Chest Port 1 View  05/27/2015  CLINICAL DATA:  Shortness of breath EXAM: PORTABLE CHEST 1 VIEW COMPARISON:  08/28/2014 FINDINGS: Chronic lung disease with fibrosis and volume loss at the left apex. There is diffuse emphysematous change and hyperinflation. Normal heart size. Stable mediastinal contours with left hilar retraction. Bronchiectasis at the left apex. No acute superimposed finding. IMPRESSION: 1. No acute finding.  Baseline appearance of the chest. 2. Advanced emphysema. 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 Electronically Signed   By: Marnee Spring M.D.   On: 05/27/2015 23:55    Scheduled Meds: . arformoterol  15 mcg Nebulization BID  . azithromycin  500 mg Intravenous Q24H  . budesonide  0.5 mg Nebulization BID  . enoxaparin (LOVENOX) injection  30 mg Subcutaneous Q24H  . fluticasone  2 spray Each Nare QHS  . irbesartan  300 mg Oral Daily   And  . hydrochlorothiazide  25 mg Oral Daily  .  ipratropium-albuterol  3 mL Nebulization Q6H  . methylPREDNISolone (SOLU-MEDROL) injection  40 mg Intravenous Q12H  . montelukast  10 mg Oral QHS  . [START ON 05/29/2015] pneumococcal 23 valent vaccine  0.5 mL Intramuscular Tomorrow-1000  . sodium chloride  3 mL Intravenous Q12H   Continuous Infusions:   Principal Problem:   Acute respiratory failure with hypoxia and hypercarbia (HCC) Active Problems:   COPD exacerbation (HCC)   Essential hypertension   Acute respiratory failure with hypercapnia (HCC)    Time spent: 30 minutes     Vornheder, Katie PA-S [facility] 05/28/2015, 10:26 AM  LOS: 0 days     Addendum  I personally evaluated patient on 05/28/2015 and agree with the above findings. [name] is a pleasant 54 year old gentleman with a past medical history of chronic obstructive pulmonary disease, hypertension, tobacco abuse who was admitted to the medicine service on 05/28/2015 when he presented  with complaints of worsening shortness of breath associate with cough. He was found to be in acute hypoxemic hypercarbic respiratory failure as ABG revealed a pH of 7.208 with PCO2 of 73.2. This is likely related to chronic obstructive pulmonary disease exacerbation. He was started on IV steroids, DuoNeb's, antibiotics, admitted to the stepdown unit and placed on NPPV. This morning he should market clinical improvement. Repeat ABG showing pH of 7.355 with PCO2 of 38.9 and PO2 of 151. On physical examination patient was awake, alert, mentating well, states feeling significantly better. On lung exam. Diminished breath sounds bilaterally however there was good air movement. Continues to have scattered expiratory wheezes. His diet was advanced. Plan to transfer him to telemetry today.

## 2015-05-28 NOTE — ED Notes (Signed)
Pt. Placed in gown and placed a fan in front of pt due to pt. Requesting "air" and respiratory therapist request.

## 2015-05-28 NOTE — Progress Notes (Signed)
Pt arrive by EMS @ 2345. Pt arrived in Respiratory Distress, increased WOB, SOB, diaphoretic, tripod position. I attempted to put him on BIPAP and he jerked mask of stating "I need air, I cant breathe with that on." Multiple attempts have been made to put BIPAP mask on. Pt removes each time. He begs staff to please leave him alone so he can calm down, begging us to give him air. I have explained that BIPAP will help him if he will let me try even at a lower pressure. He agrees but pull mask off. He is currently receiving a 15mg  Albuterol CAT/Atrovent. The patient now has a fan, Dr. Preston FleetingGlick has ordered a dose of Ativan to help calm Mr. Jimmey Ralpharker in order to attempt BIPAP again. Pt refuses intubation at this time is he cant tolerate BIPAP. I have explained he is only getting worse and asked him many time to try BIPAP again. ABG was drawn with critical CO2. MD is aware. RT is monitoring.

## 2015-05-28 NOTE — ED Notes (Signed)
The patient was taking the bipap off however he was still in respiratory distress.  [name] the RT was present when I asked the patient if he wanted to be intubated and he said no.  I also asked him if he was aware that he would die if he did not have oxygen support.  Mr. [name] said yes and said, "leave me alone and let me calm down.  I'll be all right."

## 2015-05-28 NOTE — H&P (Signed)
[facility] History and Physical  Arinze Rivadeneira ZOX:[id number] DOB: 10/18/1960 DOA: 05/27/2015  Referring physician: Dr. Preston Fleeting. PCP: Dorrene German, MD  Specialists: None.  Chief Complaint: Shortness of breath.  HPI: Paul Miano is a 54 y.o. male with history of COPD, hypertension and previous history of tobacco abuse presents to the ER because of worsening shortness of breath over the last 2 days. Patient states he has benign productive cough denies any chest pain or any fever or chills. Patient shortness of breath is present even at rest. In the ER patient was found to be wheezing and ABG shows acute respiratory failure with hypercapnia. Patient was placed on BiPAP. Initially patient refused any intubation. On my exam patient's symptoms are largely improved on BiPAP and nebulizer use. At this time patient states if he gets sicker he is okay with getting intubated. Patient otherwise denies any nausea vomiting abdominal pain diarrhea fever chills.   Review of Systems: As presented in the history of presenting illness, rest negative.  Past Medical History  Diagnosis Date  . Hypertension   . Arthritis   . COPD (chronic obstructive pulmonary disease) (HCC)   . Asthma    Past Surgical History  Procedure Laterality Date  . Replacement total knee     Social History:  reports that he quit smoking about 3 months ago. He does not have any smokeless tobacco history on file. He reports that he drinks about 1.2 oz of alcohol per week. He reports that he does not use illicit drugs. Where does patient live home. Can patient participate in ADLs? Yes.  No Known Allergies  Family History:  Family History  Problem Relation Age of Onset  . Hypertension Other       Prior to Admission medications   Medication Sig Start Date End Date Taking? Authorizing Provider  albuterol (PROVENTIL HFA;VENTOLIN HFA) 108 (90 BASE) MCG/ACT inhaler Inhale 2 puffs into the lungs every 6 (six) hours as needed  for wheezing. For shortness of breath and cough 03/07/12   [name], MD  budesonide-formoterol [facility]) 160-4.5 MCG/ACT inhaler Inhale 2 puffs into the lungs 2 (two) times daily.    Historical Provider, MD  fluticasone (FLONASE) 50 MCG/ACT nasal spray Place 2 sprays into the nose at bedtime.      Historical Provider, MD  HYDROcodone-acetaminophen (NORCO/VICODIN) 5-325 MG per tablet Take 2 tablets by mouth every 4 (four) hours as needed. 08/28/14   [name], MD  ibuprofen (ADVIL,MOTRIN) 600 MG tablet Take 600 mg by mouth every 6 (six) hours as needed (for pain).    Historical Provider, MD  lisinopril (PRINIVIL,ZESTRIL) 10 MG tablet Take 10 mg by mouth daily. 08/25/14   Historical Provider, MD  montelukast (SINGULAIR) 10 MG tablet Take 1 tablet (10 mg total) by mouth at bedtime. 03/07/12   [name], MD  oxyCODONE-acetaminophen (PERCOCET) 10-325 MG per tablet Take 2 tablets by mouth every 6 (six) hours as needed for pain.    Historical Provider, MD  predniSONE (DELTASONE) 20 MG tablet Take 1 tablet (20 mg total) by mouth daily with breakfast. 08/28/14   [name], MD  tiotropium (SPIRIVA HANDIHALER) 18 MCG inhalation capsule Place 1 capsule (18 mcg total) into inhaler and inhale every morning. 10/15/13   [name], MD  valsartan-hydrochlorothiazide (DIOVAN-HCT) 320-25 MG per tablet Take 1 tablet by mouth daily.    Historical Provider, MD    Physical Exam: Filed Vitals:   05/28/15 0300 05/28/15 0315 05/28/15 0342 05/28/15 0347  BP: 118/96 119/98  157/102  Pulse: 111 109  111  Temp:    97.4 F (36.3 C)  TempSrc:    Oral  Resp: Height:     (1.778 m)  Weight:    58.2 kg (128 lb 4.9 oz)  SpO2: 100% 100% 100% 100%     General:  Moderately built and nourished.  Eyes: Anicteric no pallor.  ENT: No discharge from the ears eyes nose and mouth.  Neck: No mass felt. No JVD appreciated.  Cardiovascular: S1-S2 heard.  Respiratory: Bilateral expiratory wheezes but  no crepitations.  Abdomen: Soft nontender bowel sounds present.  Skin: No rash.  Musculoskeletal: No edema.  Psychiatric: Appears normal.  Neurologic: Alert awake oriented to time place and person. Moves all extremities.  Labs on Admission:  Basic Metabolic Panel:  Recent Labs Lab 05/27/15 2352  NA 137  K 3.7  CL 103  CO2 25  GLUCOSE 130*  BUN 8  CREATININE 0.83  CALCIUM 9.1  MG 2.9*   Liver Function Tests: No results for input(s): AST, ALT, ALKPHOS, BILITOT, PROT, ALBUMIN in the last 168 hours. No results for input(s): LIPASE, AMYLASE in the last 168 hours. No results for input(s): AMMONIA in the last 168 hours. CBC:  Recent Labs Lab 05/27/15 2352  WBC 15.2*  NEUTROABS 7.9*  HGB 16.0  HCT 46.3  MCV 91.1  PLT 370   Cardiac Enzymes:  Recent Labs Lab 05/27/15 2352  TROPONINI <0.03    BNP (last 3 results)  Recent Labs  08/28/14 1834  BNP 48.0    ProBNP (last 3 results) No results for input(s): PROBNP in the last 8760 hours.  CBG: No results for input(s): GLUCAP in the last 168 hours.  Radiological Exams on Admission: Dg Chest Port 1 View  05/27/2015  CLINICAL DATA:  Shortness of breath EXAM: PORTABLE CHEST 1 VIEW COMPARISON:  08/28/2014 FINDINGS: Chronic lung disease with fibrosis and volume loss at the left apex. There is diffuse emphysematous change and hyperinflation. Normal heart size. Stable mediastinal contours with left hilar retraction. Bronchiectasis at the left apex. No acute superimposed finding. IMPRESSION: 1. No acute finding.  Baseline appearance of the chest. 2. Advanced emphysema. 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 Electronically Signed   By: Marnee Spring M.D.   On: 05/27/2015 23:55    EKG: Independently reviewed. Sinus tachycardia.  Assessment/Plan Principal Problem:   Acute respiratory failure with hypoxia and hypercarbia (HCC) Active Problems:   COPD exacerbation (HCC)   Essential  hypertension   Acute respiratory failure with hypercapnia (HCC)   1. Acute respiratory failure with hypercapnia secondary to COPD exacerbation - I have placed patient on albuterol, Atrovent and Pulmicort and Zithromax. IV steroids. I have ordered a repeat ABG. I have discussed with on-call pulmonary critical care Dr. Tyson Alias who will be seeing patient in consult. 2. Hypertension uncontrolled - we'll continue with Diovan HCTZ. I also place patient on when necessary IV hydralazine. Closely monitor blood pressure trends. 3. Bronchiectasis per the chest x-ray reports - patient is on antibiotics.  I have reviewed patient's old charts and labs. Personally reviewed x-ray and EKG. Discuss with pulmonary critical care.  Patient had initially refused intubation but on my discussion with the patient patient states if he gets sick he wants to be full code.   DVT Prophylaxis Lovenox.  Code Status: Full code.  Family Communication: Discussed with patient.  Disposition Plan: Admit to inpatient.    KAKRAKANDY,ARSHAD N. [facility] Pager [phone].  If 7PM-7AM, please contact night-coverage [email or web address] Password [email or web address] 05/28/2015, 4:37 AM

## 2015-05-28 NOTE — Consult Note (Signed)
Name: [name] MRN: [id number] DOB: Dec 25, 1960    ADMISSION DATE:  05/27/2015 CONSULTATION DATE:  05/28/15  REFERRING MD :  Toniann Fail  CHIEF COMPLAINT:  SOB  BRIEF PATIENT DESCRIPTION: 54 y.o. male  br[facility] ED [facility]/10 with SOB.  ABG revealed hypercarbic respiratory failure for which he was placed on BiPAP and PCCM called for further recs.  SIGNIFICANT EVENTS  11/10 - admitted with AECOPD and hypercarbic respiratory failure.  STUDIES:  CXR 11/10 >>> no acute findings.  Advance emphysema with left apical fibrosis and btx.   HISTORY OF PRESENT ILLNESS: Pt is asleep and is on BiPAP; therefore, this HPI is obtained from chart review.  [name] is a 54 y.o. male with a PMH of HTN, arthritis, COPD, Asthma.  He presented to [facility] ED during early AM hours 11/10 for SOB x 1 day.  He apparently called EMS for SOB and upon their arrival, he was in distress, tripoding with accessory muscle use and had SpO2 of 74%.  He was initially placed on CPAP but did not tolerate it.  He received breathing treatments en route to ED.  In ED, ABG revealed hypercarbic respiratory failure.  He was placed on BiPAP but adamantly refused and informed staff he would not keep it on.  He was reportedly asked by RT, RN, and EDP whether he would rather be intubated and per notes, he stated that he would not want to be intubated even if it meant that he would die.  He was then given a low dose of Lorazepam which helped take the edge off enough that pt would tolerate BiPAP.  Once he arrived to the floor, RN broached the topic of intubation again and per her report, pt stated he would in fact be OK with intubation but only if it meant life or death and he were unresponsive; however, as long as he were awake and responsive, he would not want to be intubated.    PAST MEDICAL HISTORY :   has a past medical history of Hypertension; Arthritis; COPD (chronic obstructive pulmonary disease) (HCC); and Asthma.  has past surgical  history that includes Replacement total knee. Prior to Admission medications   Medication Sig Start Date End Date Taking? Authorizing Provider  albuterol (PROVENTIL HFA;VENTOLIN HFA) 108 (90 BASE) MCG/ACT inhaler Inhale 2 puffs into the lungs every 6 (six) hours as needed for wheezing. For shortness of breath and cough 03/07/12   [name], MD  budesonide-formoterol [facility]) 160-4.5 MCG/ACT inhaler Inhale 2 puffs into the lungs 2 (two) times daily.    Historical Provider, MD  fluticasone (FLONASE) 50 MCG/ACT nasal spray Place 2 sprays into the nose at bedtime.      Historical Provider, MD  HYDROcodone-acetaminophen (NORCO/VICODIN) 5-325 MG per tablet Take 2 tablets by mouth every 4 (four) hours as needed. 08/28/14   [name], MD  ibuprofen (ADVIL,MOTRIN) 600 MG tablet Take 600 mg by mouth every 6 (six) hours as needed (for pain).    Historical Provider, MD  lisinopril (PRINIVIL,ZESTRIL) 10 MG tablet Take 10 mg by mouth daily. 08/25/14   Historical Provider, MD  montelukast (SINGULAIR) 10 MG tablet Take 1 tablet (10 mg total) by mouth at bedtime. 03/07/12   [name], MD  oxyCODONE-acetaminophen (PERCOCET) 10-325 MG per tablet Take 2 tablets by mouth every 6 (six) hours as needed for pain.    Historical Provider, MD  predniSONE (DELTASONE) 20 MG tablet Take 1 tablet (20 mg total) by mouth daily with breakfast. 08/28/14  Rolland PorterMark James, MD  tiotropium (SPIRIVA HANDIHALER) 18 MCG inhalation capsule Place 1 capsule (18 mcg total) into inhaler and inhale every morning. 10/15/13   Rolland PorterMark James, MD  valsartan-hydrochlorothiazide (DIOVAN-HCT) 320-25 MG per tablet Take 1 tablet by mouth daily.    Historical Provider, MD   No Known Allergies  FAMILY HISTORY:  family history includes Hypertension in his other. SOCIAL HISTORY:  reports that he quit smoking about 3 months ago. He does not have any smokeless tobacco history on file. He reports that he drinks about 1.2 oz of alcohol per week. He reports  that he does not use illicit drugs.  REVIEW OF SYSTEMS:   Unable to obtain as pt is asleep and is on BiPAP.   SUBJECTIVE:   VITAL SIGNS: Temp:  [97.4 F (36.3 C)-98.2 F (36.8 C)] 97.4 F (36.3 C) (11/11 0347) Pulse Rate:  [109-142] 111 (11/11 0347) Resp:  [20-42] 22 (11/11 0347) BP: (115-234)/(86-156) 157/102 mmHg (11/11 0347) SpO2:  [96 %-100 %] 100 % (11/11 0347) FiO2 (%):  [40 %-60 %] 40 % (11/11 0347) Weight:  [58.2 kg (128 lb 4.9 oz)] 58.2 kg (128 lb 4.9 oz) (11/11 0347)  PHYSICAL EXAMINATION: General: Adult male, resting in bed, in NAD. Neuro: Asleep and on BiPAP - neuro not examined. HEENT: [place]/AT.  BiPAP in place. Cardiovascular: RRR, no M/R/G.  Lungs: Respirations even and unlabored.  Faint expiratory wheeze. Abdomen: BS x 4, soft, NT/ND.  Musculoskeletal: No gross deformities, no edema.  Skin: Intact, warm, no rashes.     Recent Labs Lab 05/27/15 2352  NA 137  K 3.7  CL 103  CO2 25  BUN 8  CREATININE 0.83  GLUCOSE 130*    Recent Labs Lab 05/27/15 2352  HGB 16.0  HCT 46.3  WBC 15.2*  PLT 370   Dg Chest Port 1 View  05/27/2015  CLINICAL DATA:  Shortness of breath EXAM: PORTABLE CHEST 1 VIEW COMPARISON:  08/28/2014 FINDINGS: Chronic lung disease with fibrosis and volume loss at the left apex. There is diffuse emphysematous change and hyperinflation. Normal heart size. Stable mediastinal contours with left hilar retraction. Bronchiectasis at the left apex. No acute superimposed finding. IMPRESSION: 1. No acute finding.  Baseline appearance of the chest. 2. Advanced emphysema. 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 Electronically Signed   By: Marnee SpringJonathon  Watts M.D.   On: 05/27/2015 23:55    ASSESSMENT / PLAN:  Acute hypoxic and hypercarbic respiratory failure. AECOPD - no PFT's in system. Plan: Continue BiPAP for tonight. No role intubation at this time. Transition to supplemental O2 via [place] in AM if remains hypoxic. Will  need ambulatory desat study prior to discharge to ensure he does not need supplemental O2 as outpatient. Would obtain PFT's once over acute phase. DuoNebs in lieu of outpatient spiriva. Budesonide / Brovana in lieu of outpatient symbicort. Albuterol PRN - would keep nebs vs inhaler during acute phase as they will allow for enhanced delivery. Continue outpatient singulair. Solumedrol in lieu of outpatient prednisone. Continue empiric azithro. Sputum culture. Check PCT. CXR this AM.  Rest per primary.  Attending to follow in AM.  If pt remains stable and CXR remains clear, PCCM will likely sign off.  Please do not hesitate to call back if we can be of any further assistance.   Rutherford Guysahul Teola Felipe, [place] - [facility] [place] [facility] Pager: [phone]  or [phone] 05/28/2015, 5:00 AM

## 2015-05-28 NOTE — Progress Notes (Signed)
Critical ABG results reported to Dr. Preston FleetingGlick.

## 2015-05-28 NOTE — Progress Notes (Signed)
Pt was transported to 3S07 without complication. Report was given to Alysha, RT.

## 2015-05-29 DIAGNOSIS — I10 Essential (primary) hypertension: Secondary | ICD-10-CM

## 2015-05-29 LAB — CBC
HCT: 39.8 % (ref 39.0–52.0)
Hemoglobin: 13.8 g/dL (ref 13.0–17.0)
MCH: 30.7 pg (ref 26.0–34.0)
MCHC: 34.7 g/dL (ref 30.0–36.0)
MCV: 88.6 fL (ref 78.0–100.0)
Platelets: 349 10*3/uL (ref 150–400)
RBC: 4.49 MIL/uL (ref 4.22–5.81)
RDW: 13.7 % (ref 11.5–15.5)
WBC: 9.8 10*3/uL (ref 4.0–10.5)

## 2015-05-29 LAB — BASIC METABOLIC PANEL
ANION GAP: 8 (ref 5–15)
BUN: 23 mg/dL — AB (ref 6–20)
CALCIUM: 9.8 mg/dL (ref 8.9–10.3)
CO2: 25 mmol/L (ref 22–32)
Chloride: 101 mmol/L (ref 101–111)
Creatinine, Ser: 1.01 mg/dL (ref 0.61–1.24)
GFR calc Af Amer: >60 mL/min (ref >60)
GLUCOSE: 165 mg/dL — AB (ref 65–99)
POTASSIUM: 4.2 mmol/L (ref 3.5–5.1)
SODIUM: 134 mmol/L — AB (ref 135–145)

## 2015-05-29 LAB — GLUCOSE, CAPILLARY
GLUCOSE-CAPILLARY: 109 mg/dL — AB (ref 65–99)
GLUCOSE-CAPILLARY: 161 mg/dL — AB (ref 65–99)
GLUCOSE-CAPILLARY: 176 mg/dL — AB (ref 65–99)
Glucose-Capillary: 188 mg/dL — ABNORMAL HIGH (ref 65–99)

## 2015-05-29 MED ORDER — DOXYCYCLINE HYCLATE 50 MG PO CAPS: 50.0000 mg | ORAL_CAPSULE | Freq: Two times a day (BID) | ORAL | Status: AC

## 2015-05-29 MED ORDER — PREDNISONE 10 MG (21) PO TBPK: ORAL_TABLET | ORAL | Status: AC

## 2015-05-29 MED ORDER — PREDNISONE 10 MG (21) PO TBPK: ORAL_TABLET | ORAL | Status: DC

## 2015-05-29 MED ORDER — BUDESONIDE-FORMOTEROL FUMARATE 160-4.5 MCG/ACT IN AERO: 2.0000 | INHALATION_SPRAY | Freq: Two times a day (BID) | RESPIRATORY_TRACT | Status: AC

## 2015-05-29 NOTE — Progress Notes (Signed)
Pt given duoneb treatment. Patient states he feels better afterward. Bilateral exp wheezing still present in lungs.

## 2015-05-29 NOTE — Discharge Summary (Signed)
Physician Discharge Summary  [name] ZOX:[id number] DOB: 09-27-1960 DOA: 05/27/2015  PCP: Dorrene German, MD  Admit date: 05/27/2015 Discharge date: 05/29/2015  Time spent: 35 minutes  Recommendations for Outpatient Follow-up:  1. Please follow up on respiratory status, he was treated for COPD excerbation    Discharge Diagnoses:  Principal Problem:   Acute respiratory failure with hypoxia and hypercarbia (HCC) Active Problems:   COPD exacerbation (HCC)   Essential hypertension   Acute respiratory failure with hypercapnia (HCC)   Discharge Condition: Stable/Improved  Diet recommendation: Heart Healthy  Filed Weights   05/28/15 0347 05/28/15 1440 05/29/15 0434  Weight: 58.2 kg (128 lb 4.9 oz) 58.015 kg (127 lb 14.4 oz) 57.743 kg (127 lb 4.8 oz)    History of present illness:  [name] is a 54 y.o. male with history of COPD, hypertension and previous history of tobacco abuse presents to the ER because of worsening shortness of breath over the last 2 days. Patient states he has benign productive cough denies any chest pain or any fever or chills. Patient shortness of breath is present even at rest. In the ER patient was found to be wheezing and ABG shows acute respiratory failure with hypercapnia. Patient was placed on BiPAP. Initially patient refused any intubation. On my exam patient's symptoms are largely improved on BiPAP and nebulizer use. At this time patient states if he gets sicker he is okay with getting intubated. Patient otherwise denies any nausea vomiting abdominal pain diarrhea fever chills.   Hospital Course:  Mr. [name] is a 54 y/o male with PMH of COPD, HTN and tobacco abuse who presented to the ED 11/10 with complaints of worsening SOB. He stated that he had run out of most of his COPD medications for several days and began feeling SOB Wednesday evening and this continued throughout the day. He has a non-productive cough, denies fever, chills. Upon arrival to  the ED patient was found to be wheezing with acute respiratory failure and hypercapnia. He was placed on BiPAP and admitted for further evaluation.   Acute Respiratory Failure with Hypercapnia  -Patient admitted 11/10 with complaints of worsening SOB, improved with BiPAP, patient is not requiring oxygen at this point. Tolerating this well without hypoxia, states he feels much better  -Resolving, repeat ABG at 0400 with pC02 38.9 and pH 7.35 (compared to 73.2 and 7.2 on admission)  -PCCM consulted with plan for DuoNebs, Budesonide, PRN Albuterol and solumedrol  -On 05/29/2015 he had an ambulatory pulse-ox of 99%. Given significant clinical improvement he was discharged on this date with prednisone taper.  COPD exacerbation -Likely precipitating respiratory failure.  -She showed significant clinical improvement with treatment with IV steroids, nebs and antibiotic therapy.  -On day of discharged had pulse-ox of 99% ambulatory on RA -Discharged on prednisone taper.   Hypertension  -BP elevated upon admission, stable now in the 120s -Will discharge on Irbesartan/HCTZ     Accuchecks 4 times/day, Once in AM empty stomach and then before each meal. Log in all results and show them to your Prim.MD in 3 days. If any glucose reading is under 80 or above 300 call your Prim MD immidiately. Follow Low glucose instructions for glucose under 80 as instructed.  For Heart failure patients - Check your Weight same time everyday, if you gain over 2 pounds, or you develop in leg swelling, experience more shortness of breath or chest pain, call your Primary MD immediately. Follow Cardiac Low Salt Diet and 1.5 lit/day fluid restriction.  On your next visit with your primary care physician please Get Medicines reviewed and adjusted.   Please request your Prim.MD to go over all Hospital Tests and Procedure/Radiological results at the follow up, please get all Hospital records sent to your Prim MD by  signing hospital release before you go home.   If you experience worsening of your admission symptoms, develop shortness of breath, life threatening emergency, suicidal or homicidal thoughts you must seek medical attention immediately by calling 911 or calling your MD immediately if symptoms less severe.  You Must read complete instructions/literature along with all the possible adverse reactions/side effects for all the Medicines you take and that have been prescribed to you. Take any new Medicines after you have completely understood and accpet all the possible adverse reactions/side effects.   Do not drive, operating heavy machinery, perform activities at heights, swimming or participation in water activities or provide baby sitting services if your were admitted for syncope or siezures until you have seen by Primary MD or a Neurologist and advised to do so again.  Do not drive when taking Pain medications.    Do not take more than prescribed Pain, Sleep and Anxiety Medications  Special Instructions: If you have smoked or chewed Tobacco in the last 2 yrs please stop smoking, stop any regular Alcohol and or any Recreational drug use.  Wear Seat belts while driving.   Please note  You were cared for by a hospitalist during your hospital stay. If you have any questions about your discharge medications or the care you received while you were in the hospital after you are discharged, you can call the unit and asked to speak with the hospitalist on call if the hospitalist that took care of you is not available. Once you are discharged, your primary care physician will handle any further medical issues. Please note that NO REFILLS for any discharge medications will be authorized once you are discharged, as it is imperative that you return to your primary care physician (or establish a relationship with a primary care physician if you do not have one) for your aftercare needs so that they can  reassess your need for medications and monitor your lab values.  Discharge Exam: Filed Vitals:   05/29/15 0827  BP: 167/93  Pulse: 102  Temp: 98.8 F (37.1 C)  Resp: 20     General: Pleasant male, sitting upright in bed, NAD, ambulating down the hall  Cardiovascular: Tachycardic, regular rhythm, no murmurs  Respiratory: Has bilateral expiratory wheezes, overall improved from yesterday's exam.   Abdomen: Soft, non-tender, bowel sounds present all 4 quadrants  Musculoskeletal: 5/5 strength all four extremities.   Discharge Instructions   Discharge Instructions    Diet - low sodium heart healthy    Complete by:  As directed      Increase activity slowly    Complete by:  As directed           Current Discharge Medication List    START taking these medications   Details  doxycycline (VIBRAMYCIN) 50 MG capsule Take 1 capsule (50 mg total) by mouth 2 (two) times daily. Qty: 10 capsule, Refills: 0    predniSONE (STERAPRED UNI-PAK 21 TAB) 10 MG (21) TBPK tablet Take 6-5-4-3-2-1 tablets by mouth daily, then continue at 5 mg PO q daily after taper Qty: 30 tablet, Refills: 0      CONTINUE these medications which have NOT CHANGED   Details  albuterol (PROVENTIL HFA;VENTOLIN HFA) 108 (90  BASE) MCG/ACT inhaler Inhale 2 puffs into the lungs every 6 (six) hours as needed for wheezing. For shortness of breath and cough Qty: 6.7 g, Refills: 1    budesonide-formoterol (SYMBICORT) 160-4.5 MCG/ACT inhaler Inhale 2 puffs into the lungs 2 (two) times daily.    fluticasone (FLONASE) 50 MCG/ACT nasal spray Place 2 sprays into the nose at bedtime.      lisinopril (PRINIVIL,ZESTRIL) 10 MG tablet Take 10 mg by mouth daily. Refills: 1    montelukast (SINGULAIR) 10 MG tablet Take 1 tablet (10 mg total) by mouth at bedtime. Qty: 30 tablet, Refills: 0    oxyCODONE-acetaminophen (PERCOCET) 10-325 MG per tablet Take 2 tablets by mouth every 6 (six) hours as needed for pain.     tiotropium (SPIRIVA HANDIHALER) 18 MCG inhalation capsule Place 1 capsule (18 mcg total) into inhaler and inhale every morning. Qty: 30 capsule, Refills: 0    valsartan-hydrochlorothiazide (DIOVAN-HCT) 320-25 MG per tablet Take 1 tablet by mouth daily.      STOP taking these medications     predniSONE (DELTASONE) 5 MG tablet      HYDROcodone-acetaminophen (NORCO/VICODIN) 5-325 MG per tablet        No Known Allergies Follow-up Information    Follow up with AVBUERE,EDWIN A, MD In 1 week.   Specialty:  Internal Medicine   Contact information:   [address] [place] [address] [phone]        The results of significant diagnostics from this hospitalization (including imaging, microbiology, ancillary and laboratory) are listed below for reference.    Significant Diagnostic Studies: Dg Chest Port 1 View  05/28/2015  CLINICAL DATA:  Respiratory failure EXAM: PORTABLE CHEST 1 VIEW COMPARISON:  May 27, 2015 and December 18, 2011 FINDINGS: There is extensive underlying emphysematous change. There is retraction on the left with areas of scarring and fibrotic change in the left upper lobe/apex regions, stable. There is bronchiectatic change in the left apex as well, stable. Interstitium is prominent in the right mid lower lung zones, largely due to redistribution of blood flow to a viable lung segments. There is scarring in the lateral right base region with blunting of the right costophrenic angle. There is no frank edema or consolidation. The heart size is within normal limits. The pulmonary vascularity reflects underlying emphysematous change is stable. No adenopathy apparent. IMPRESSION: No change from 1 day prior. Extensive emphysematous change with areas of fibrosis and scarring. There is left apical bronchiectatic change. No new opacity. No change in cardiac silhouette. Electronically Signed   By: Bretta Bang III M.D.   On: 05/28/2015 07:24   Dg Chest Port 1  View  05/27/2015  CLINICAL DATA:  Shortness of breath EXAM: PORTABLE CHEST 1 VIEW COMPARISON:  08/28/2014 FINDINGS: Chronic lung disease with fibrosis and volume loss at the left apex. There is diffuse emphysematous change and hyperinflation. Normal heart size. Stable mediastinal contours with left hilar retraction. Bronchiectasis at the left apex. No acute superimposed finding. IMPRESSION: 1. No acute finding.  Baseline appearance of the chest. 2. Advanced emphysema. 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 Electronically Signed   By: Marnee Spring M.D.   On: 05/27/2015 23:55    Microbiology: Recent Results (from the past 240 hour(s))  MRSA PCR Screening     Status: None   Collection Time: 05/28/15  3:50 AM  Result Value Ref Range Status   MRSA by PCR NEGATIVE NEGATIVE Final    Comment:  The GeneXpert MRSA Assay (FDA approved for NASAL specimens only), is one component of a comprehensive MRSA colonization surveillance program. It is not intended to diagnose MRSA infection nor to guide or monitor treatment for MRSA infections.      Labs: Basic Metabolic Panel:  Recent Labs Lab 05/27/15 2352 05/28/15 0654 05/29/15 0402  NA 137 134* 134*  K 3.7 4.0 4.2  CL 103 99* 101  CO2 25 20* 25  GLUCOSE 130* 131* 165*  BUN 8 15 23*  CREATININE 0.83 0.98 1.01  CALCIUM 9.1 9.0 9.8  MG 2.9*  --   --    Liver Function Tests:  Recent Labs Lab 05/28/15 0654  AST 20  ALT 11*  ALKPHOS 60  BILITOT 1.0  PROT 7.4  ALBUMIN 3.8   No results for input(s): LIPASE, AMYLASE in the last 168 hours. No results for input(s): AMMONIA in the last 168 hours. CBC:  Recent Labs Lab 05/27/15 2352 05/28/15 0654 05/29/15 0402  WBC 15.2* 13.1* 9.8  NEUTROABS 7.9* 12.6*  --   HGB 16.0 14.9 13.8  HCT 46.3 43.9 39.8  MCV 91.1 90.5 88.6  PLT 370 337 349   Cardiac Enzymes:  Recent Labs Lab 05/27/15 2352  TROPONINI <0.03   BNP: BNP (last 3 results)  Recent  Labs  08/28/14 1834  BNP 48.0    ProBNP (last 3 results) No results for input(s): PROBNP in the last 8760 hours.  CBG:  Recent Labs Lab 05/28/15 1718 05/28/15 2126 05/29/15 0057 05/29/15 0500 05/29/15 0805  GLUCAP 132* 143* 109* 161* 176*       Signed:  Kirah Stice  [facility] 05/29/2015, 9:44 AM

## 2015-07-18 DEATH — deceased

## 2016-12-29 IMAGING — CR DG CHEST 1V PORT
1 series · 1 of 1 positions shown · non-contrast
Comparison: 08/28/2014

CLINICAL DATA: Shortness of breath

EXAM:
PORTABLE CHEST 1 VIEW

[AP]
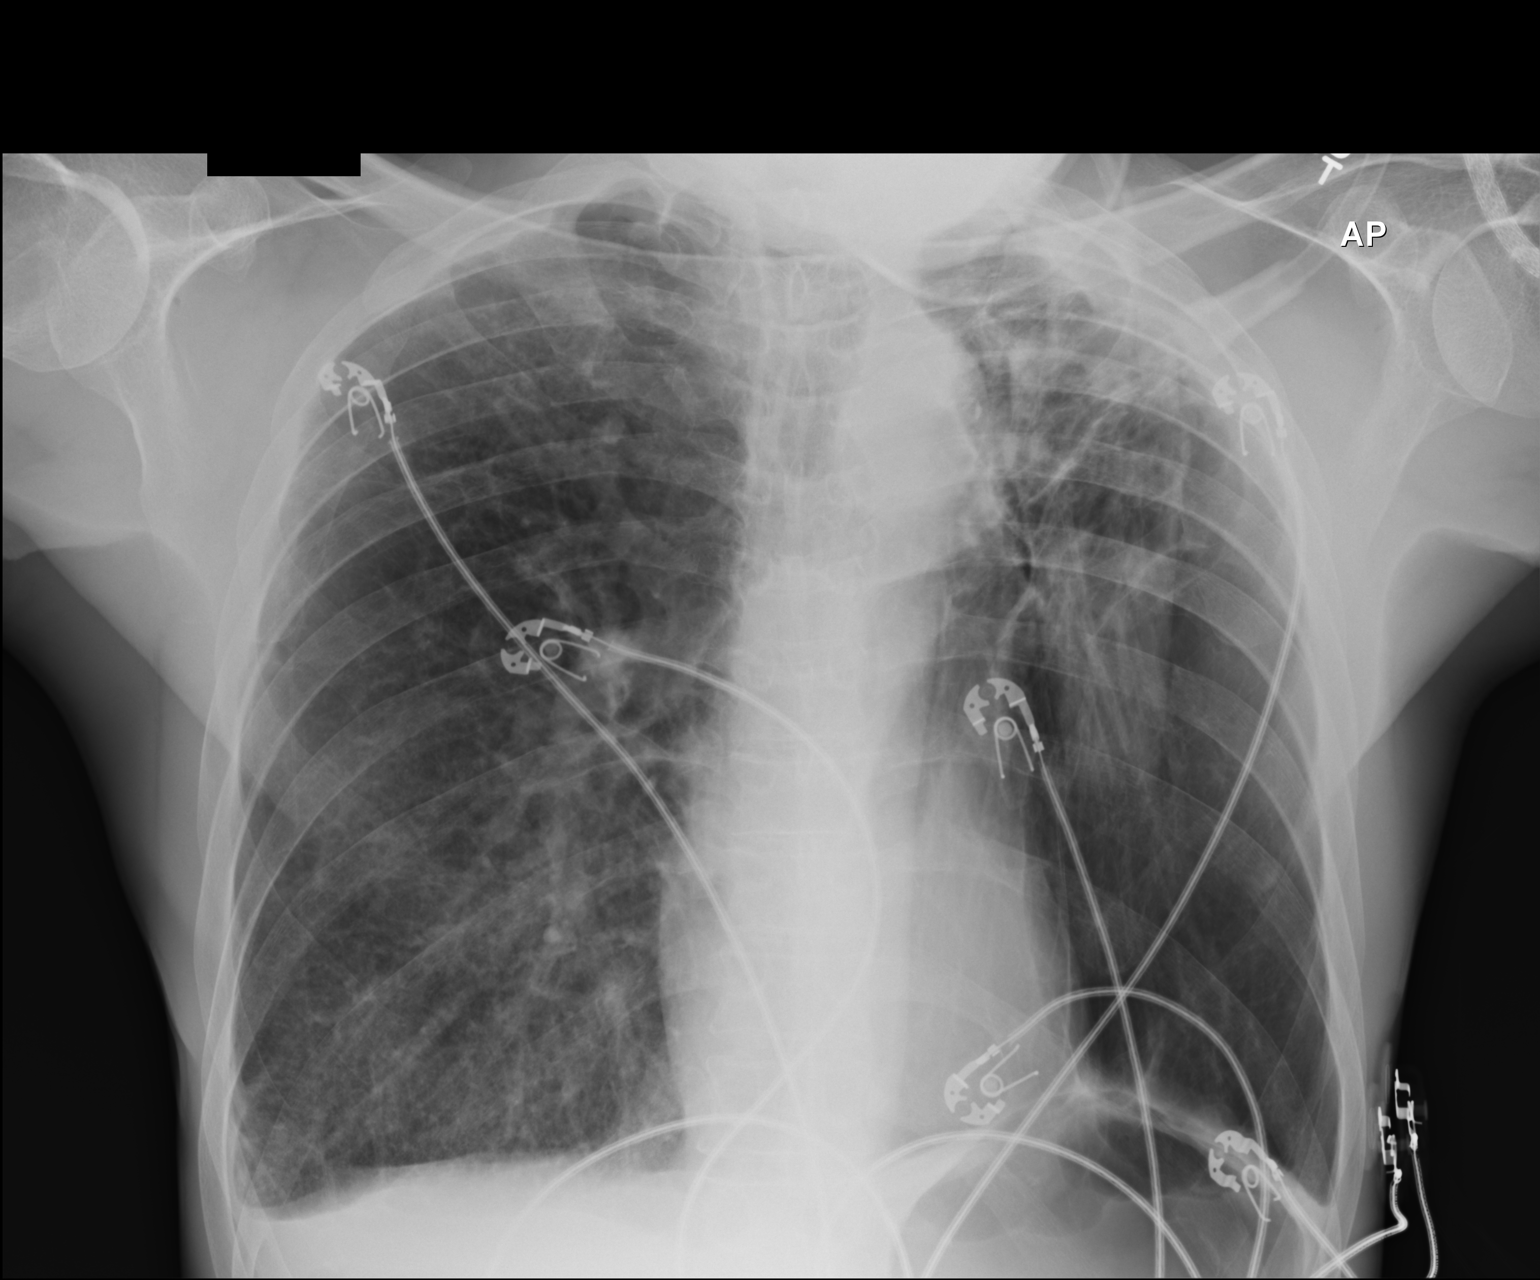

[1 of 1 positions shown; findings below may reference images not displayed]

FINDINGS: Chronic lung disease with fibrosis and volume loss at the left apex.
There is diffuse emphysematous change and hyperinflation. Normal
heart size. Stable mediastinal contours with left hilar retraction.
Bronchiectasis at the left apex. No acute superimposed finding.
IMPRESSION: 1. No acute finding.  Baseline appearance of the chest.
2. Advanced emphysema. Left apical fibrosis and bronchiectasis,
likely postinfectious.

## 2016-12-30 IMAGING — CR DG CHEST 1V PORT
2 series · 2 of 2 positions shown · non-contrast
Comparison: May 27, 2015 and December 18, 2011

CLINICAL DATA: Respiratory failure

EXAM:
PORTABLE CHEST 1 VIEW

[AP (1 of 2)]
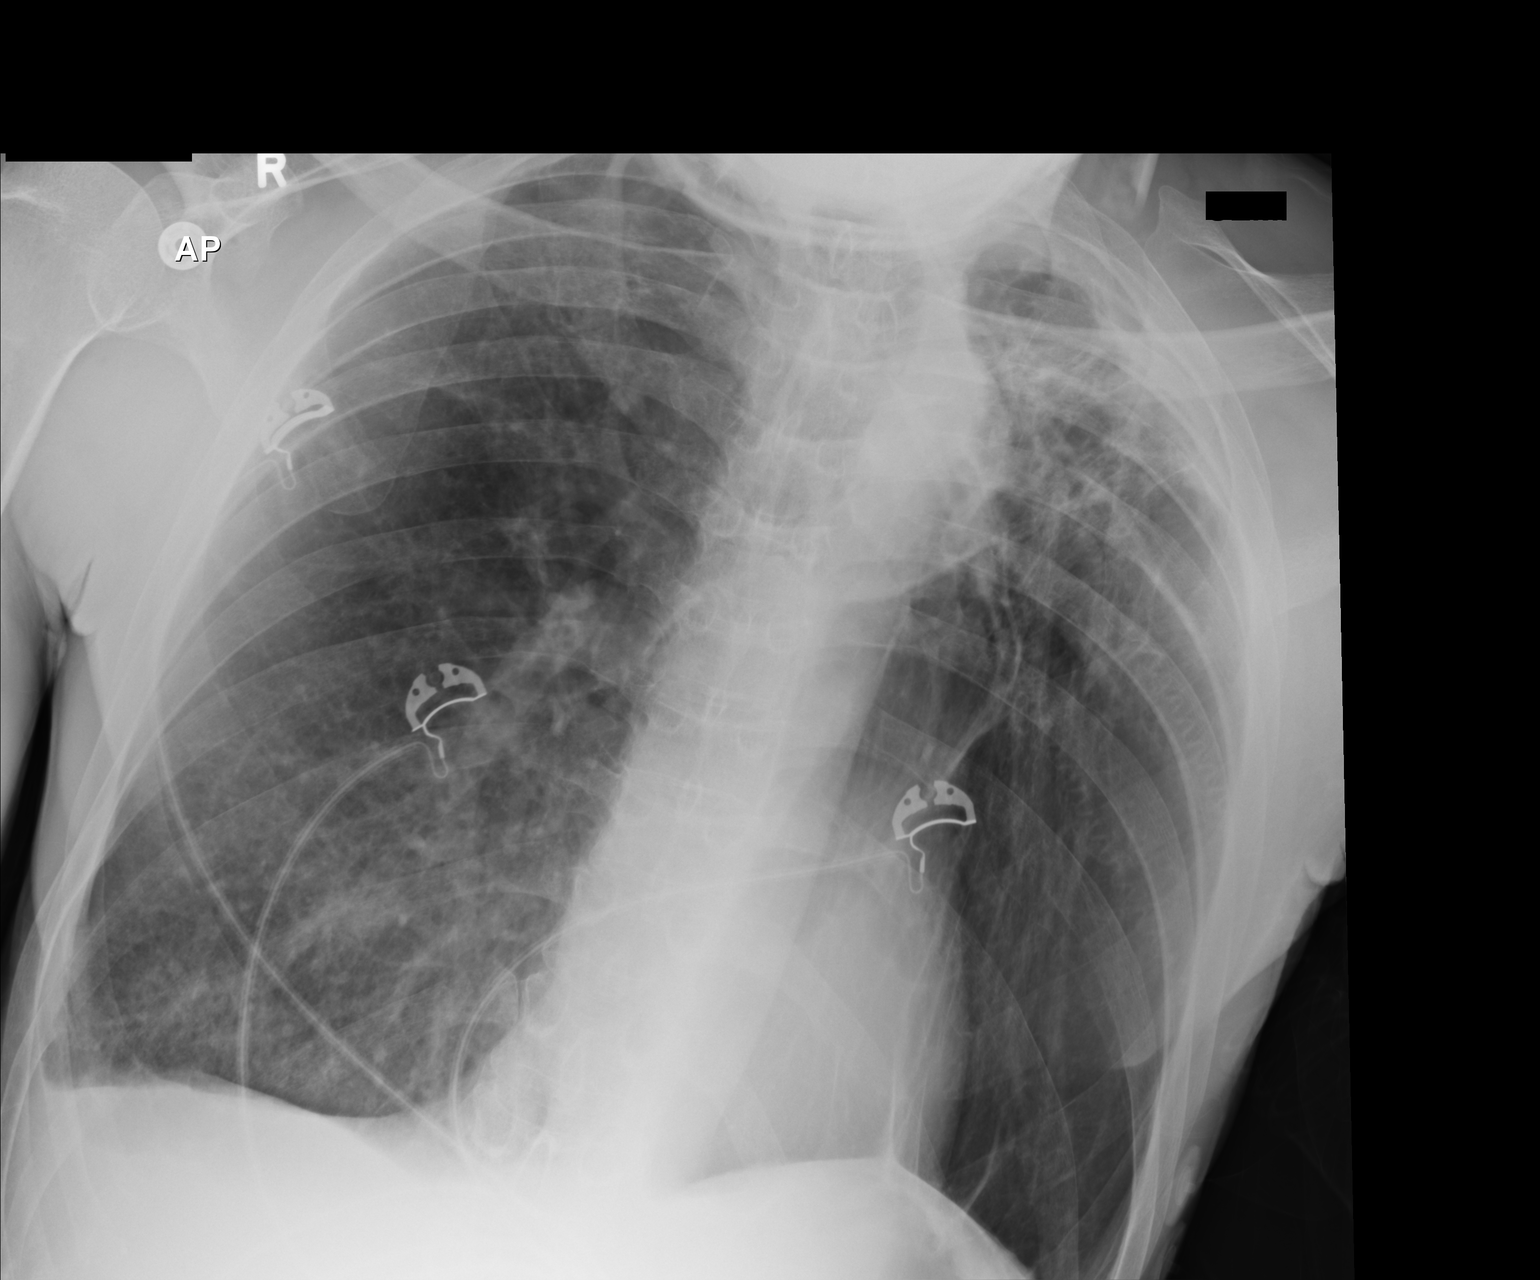

[AP (2 of 2)]
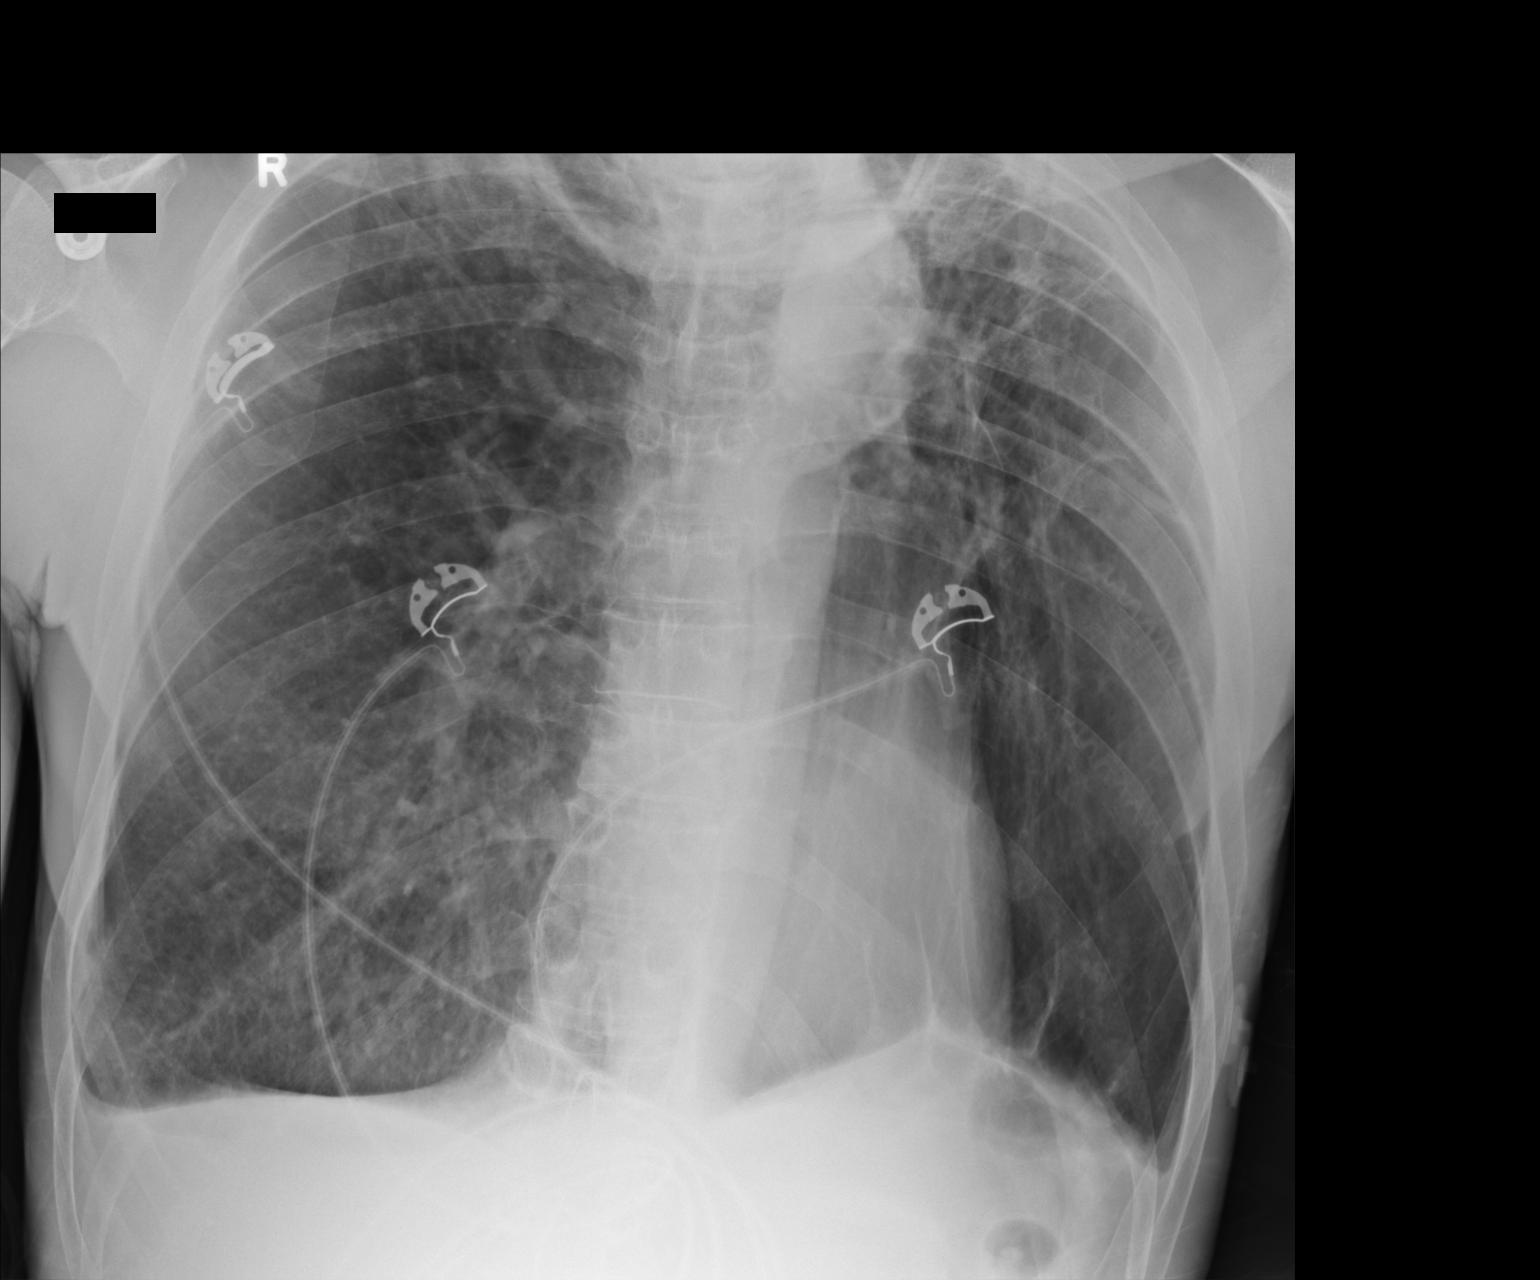

[2 of 2 positions shown; findings below may reference images not displayed]

FINDINGS: There is extensive underlying emphysematous change. There is
retraction on the left with areas of scarring and fibrotic change in
the left upper lobe/apex regions, stable. There is bronchiectatic
change in the left apex as well, stable. Interstitium is prominent
in the right mid lower lung zones, largely due to redistribution of
blood flow to a viable lung segments. There is scarring in the
lateral right base region with blunting of the right costophrenic
angle. There is no frank edema or consolidation. The heart size is
within normal limits. The pulmonary vascularity reflects underlying
emphysematous change is stable. No adenopathy apparent.
IMPRESSION: No change from 1 day prior. Extensive emphysematous change with
areas of fibrosis and scarring. There is left apical bronchiectatic
change. No new opacity. No change in cardiac silhouette.
